# Patient Record
Sex: Female | Born: 1965 | Hispanic: No | Marital: Single | Smoking: Never smoker
Health system: Southern US, Community
[De-identification: ages and names within clinical notes are randomized; demographics above are authoritative.]

## PROBLEM LIST (undated history)

## (undated) DIAGNOSIS — J302 Other seasonal allergic rhinitis: Secondary | ICD-10-CM

## (undated) DIAGNOSIS — T7840XA Allergy, unspecified, initial encounter: Secondary | ICD-10-CM

## (undated) DIAGNOSIS — M1990 Unspecified osteoarthritis, unspecified site: Secondary | ICD-10-CM

## (undated) DIAGNOSIS — K219 Gastro-esophageal reflux disease without esophagitis: Secondary | ICD-10-CM

## (undated) DIAGNOSIS — N890 Mild vaginal dysplasia: Secondary | ICD-10-CM

## (undated) DIAGNOSIS — E039 Hypothyroidism, unspecified: Secondary | ICD-10-CM

## (undated) DIAGNOSIS — L932 Other local lupus erythematosus: Secondary | ICD-10-CM

## (undated) DIAGNOSIS — IMO0002 Reserved for concepts with insufficient information to code with codable children: Secondary | ICD-10-CM

## (undated) HISTORY — DX: Allergy, unspecified, initial encounter: T78.40XA

## (undated) HISTORY — PX: POLYPECTOMY: SHX149

## (undated) HISTORY — DX: Unspecified osteoarthritis, unspecified site: M19.90

## (undated) HISTORY — DX: Hypothyroidism, unspecified: E03.9

## (undated) HISTORY — DX: Reserved for concepts with insufficient information to code with codable children: IMO0002

## (undated) HISTORY — PX: DILATION AND CURETTAGE OF UTERUS: SHX78

## (undated) HISTORY — DX: Mild vaginal dysplasia: N89.0

## (undated) HISTORY — PX: OOPHORECTOMY: SHX86

## (undated) HISTORY — PX: TONSILLECTOMY: SUR1361

## (undated) HISTORY — DX: Other local lupus erythematosus: L93.2

## (undated) HISTORY — PX: REPLACEMENT TOTAL KNEE: SUR1224

## (undated) HISTORY — PX: KNEE SURGERY: SHX244

## (undated) HISTORY — DX: Other seasonal allergic rhinitis: J30.2

## (undated) HISTORY — PX: CHOLECYSTECTOMY: SHX55

## (undated) HISTORY — DX: Gastro-esophageal reflux disease without esophagitis: K21.9

## (undated) HISTORY — PX: PELVIC LAPAROSCOPY: SHX162

## (undated) HISTORY — PX: HYSTEROSCOPY: SHX211

## (undated) HISTORY — PX: NEURECTOMY FOOT: SUR888

## (undated) HISTORY — PX: ANKLE SURGERY: SHX546

---

## 1997-12-18 ENCOUNTER — Other Ambulatory Visit: Admission: RE | Admit: 1997-12-18 | Discharge: 1997-12-18 | Payer: Self-pay | Admitting: Gynecology

## 1999-01-28 ENCOUNTER — Other Ambulatory Visit: Admission: RE | Admit: 1999-01-28 | Discharge: 1999-01-28 | Payer: Self-pay | Admitting: Gynecology

## 2000-01-26 ENCOUNTER — Other Ambulatory Visit: Admission: RE | Admit: 2000-01-26 | Discharge: 2000-01-26 | Payer: Self-pay | Admitting: Gynecology

## 2000-12-03 ENCOUNTER — Encounter (INDEPENDENT_AMBULATORY_CARE_PROVIDER_SITE_OTHER): Payer: Self-pay | Admitting: Specialist

## 2000-12-03 ENCOUNTER — Ambulatory Visit (HOSPITAL_COMMUNITY): Admission: RE | Admit: 2000-12-03 | Discharge: 2000-12-03 | Payer: Self-pay | Admitting: Gynecology

## 2001-03-04 ENCOUNTER — Other Ambulatory Visit: Admission: RE | Admit: 2001-03-04 | Discharge: 2001-03-04 | Payer: Self-pay | Admitting: Gynecology

## 2001-08-06 ENCOUNTER — Ambulatory Visit (HOSPITAL_COMMUNITY): Admission: RE | Admit: 2001-08-06 | Discharge: 2001-08-06 | Payer: Self-pay | Admitting: Internal Medicine

## 2002-06-04 ENCOUNTER — Other Ambulatory Visit: Admission: RE | Admit: 2002-06-04 | Discharge: 2002-06-04 | Payer: Self-pay | Admitting: Gynecology

## 2003-06-12 ENCOUNTER — Other Ambulatory Visit: Admission: RE | Admit: 2003-06-12 | Discharge: 2003-06-12 | Payer: Self-pay | Admitting: Gynecology

## 2004-06-07 ENCOUNTER — Ambulatory Visit (HOSPITAL_COMMUNITY): Admission: RE | Admit: 2004-06-07 | Discharge: 2004-06-07 | Payer: Self-pay | Admitting: Orthopedic Surgery

## 2004-06-07 ENCOUNTER — Ambulatory Visit (HOSPITAL_BASED_OUTPATIENT_CLINIC_OR_DEPARTMENT_OTHER): Admission: RE | Admit: 2004-06-07 | Discharge: 2004-06-07 | Payer: Self-pay | Admitting: Orthopedic Surgery

## 2005-01-02 DIAGNOSIS — IMO0002 Reserved for concepts with insufficient information to code with codable children: Secondary | ICD-10-CM

## 2005-01-02 HISTORY — DX: Reserved for concepts with insufficient information to code with codable children: IMO0002

## 2005-03-26 ENCOUNTER — Emergency Department (HOSPITAL_COMMUNITY): Admission: EM | Admit: 2005-03-26 | Discharge: 2005-03-26 | Payer: Self-pay | Admitting: Emergency Medicine

## 2005-06-13 ENCOUNTER — Other Ambulatory Visit: Admission: RE | Admit: 2005-06-13 | Discharge: 2005-06-13 | Payer: Self-pay | Admitting: Gynecology

## 2005-06-28 ENCOUNTER — Ambulatory Visit (HOSPITAL_COMMUNITY): Admission: RE | Admit: 2005-06-28 | Discharge: 2005-06-29 | Payer: Self-pay | Admitting: Surgery

## 2005-06-28 ENCOUNTER — Encounter (INDEPENDENT_AMBULATORY_CARE_PROVIDER_SITE_OTHER): Payer: Self-pay | Admitting: *Deleted

## 2005-08-02 ENCOUNTER — Ambulatory Visit (HOSPITAL_BASED_OUTPATIENT_CLINIC_OR_DEPARTMENT_OTHER): Admission: RE | Admit: 2005-08-02 | Discharge: 2005-08-02 | Payer: Self-pay | Admitting: Gynecology

## 2005-08-02 ENCOUNTER — Encounter (INDEPENDENT_AMBULATORY_CARE_PROVIDER_SITE_OTHER): Payer: Self-pay | Admitting: Specialist

## 2006-07-18 ENCOUNTER — Other Ambulatory Visit: Admission: RE | Admit: 2006-07-18 | Discharge: 2006-07-18 | Payer: Self-pay | Admitting: Gynecology

## 2007-09-19 ENCOUNTER — Ambulatory Visit: Payer: Self-pay | Admitting: Gynecology

## 2007-09-19 ENCOUNTER — Other Ambulatory Visit: Admission: RE | Admit: 2007-09-19 | Discharge: 2007-09-19 | Payer: Self-pay | Admitting: Gynecology

## 2007-09-19 ENCOUNTER — Encounter: Payer: Self-pay | Admitting: Gynecology

## 2007-10-03 ENCOUNTER — Ambulatory Visit: Payer: Self-pay | Admitting: Gynecology

## 2007-10-31 ENCOUNTER — Ambulatory Visit: Payer: Self-pay | Admitting: Gynecology

## 2007-11-15 ENCOUNTER — Ambulatory Visit: Payer: Self-pay | Admitting: Gynecology

## 2007-11-19 ENCOUNTER — Ambulatory Visit (HOSPITAL_BASED_OUTPATIENT_CLINIC_OR_DEPARTMENT_OTHER): Admission: RE | Admit: 2007-11-19 | Discharge: 2007-11-19 | Payer: Self-pay | Admitting: Gynecology

## 2007-11-19 ENCOUNTER — Ambulatory Visit: Payer: Self-pay | Admitting: Gynecology

## 2007-11-19 ENCOUNTER — Encounter: Payer: Self-pay | Admitting: Gynecology

## 2007-12-03 ENCOUNTER — Ambulatory Visit: Payer: Self-pay | Admitting: Gynecology

## 2007-12-03 HISTORY — PX: ABDOMINAL HYSTERECTOMY: SHX81

## 2007-12-18 ENCOUNTER — Encounter: Payer: Self-pay | Admitting: Gynecology

## 2007-12-18 ENCOUNTER — Ambulatory Visit: Payer: Self-pay | Admitting: Gynecology

## 2007-12-18 ENCOUNTER — Inpatient Hospital Stay (HOSPITAL_COMMUNITY): Admission: RE | Admit: 2007-12-18 | Discharge: 2007-12-20 | Payer: Self-pay | Admitting: Gynecology

## 2008-01-01 ENCOUNTER — Ambulatory Visit: Payer: Self-pay | Admitting: Gynecology

## 2008-01-15 ENCOUNTER — Ambulatory Visit: Payer: Self-pay | Admitting: Gynecology

## 2008-09-21 ENCOUNTER — Other Ambulatory Visit: Admission: RE | Admit: 2008-09-21 | Discharge: 2008-09-21 | Payer: Self-pay | Admitting: Gynecology

## 2008-09-21 ENCOUNTER — Ambulatory Visit: Payer: Self-pay | Admitting: Gynecology

## 2008-09-21 ENCOUNTER — Encounter: Payer: Self-pay | Admitting: Gynecology

## 2008-11-18 ENCOUNTER — Ambulatory Visit: Payer: Self-pay | Admitting: Gynecology

## 2009-09-28 ENCOUNTER — Ambulatory Visit: Payer: Self-pay | Admitting: Gynecology

## 2009-09-28 ENCOUNTER — Other Ambulatory Visit: Admission: RE | Admit: 2009-09-28 | Discharge: 2009-09-28 | Payer: Self-pay | Admitting: Gynecology

## 2010-05-17 NOTE — H&P (Signed)
Mandy Bond, Mandy Bond                ACCOUNT NO.:  [id number]   MEDICAL RECORD NO.:  [id number]         PATIENT TYPE:  WINP   LOCATION:  310                           FACILITY:  [facility]   PHYSICIAN:  Timothy P. Fontaine, M.D.DATE OF BIRTH:  [birth year]   DATE OF ADMISSION:  12/18/2007  DATE OF DISCHARGE:                              HISTORY & PHYSICAL   CHIEF COMPLAINT:  1. Pelvic pain.  2. Dysmenorrhea.  3. Endometriosis.   HISTORY OF PRESENT ILLNESS:  A 45 year old G0 with pelvic pain,  persistent right adnexal cystic mass, CA-125 of 79, underwent diagnostic  laparoscopy November 2009 with findings of endometriosis, cul-de-sac  obliteration, bilateral adnexal adhesions.  The laparoscopy was  terminated at that point.  She is readmitted at this time for definitive  surgery to include TAH-BSO.   PAST MEDICAL HISTORY:  Hypothyroidism.   PAST SURGICAL HISTORY:  Hysteroscopy, D&C x2, cholecystectomy,  neurectomy of the foot, laparoscopy.   ALLERGIES:  VICODIN causes headaches, TOPICAL NEOMYCIN rash.   CURRENT MEDICATIONS:  Synthroid, Prilosec, vitamin D, Claritin as  needed.   REVIEW OF SYSTEMS:  Noncontributory.   FAMILY HISTORY:  Noncontributory.   SOCIAL HISTORY:  Noncontributory.   ADMISSION PHYSICAL EXAMINATION:  VITAL SIGNS:  Afebrile.  Vital signs  are stable.  HEENT: Normal.  LUNGS:  Clear.  CARDIAC:  Regular rate.  No rubs, murmurs, or gallops.  ABDOMEN:  Benign.  PELVIC:  External BUS, vagina normal.  Cervix normal.  Uterus normal  size, midline, mobile, nontender.  Adnexa without masses or tenderness.   ASSESSMENT:  A 45 year old G0, increasing menorrhagia, dysmenorrhea,  cystic adnexal mass on the right consistent with endometrioma.  Recent  laparoscopy confirming endometriosis with chocolate cyst material noted.  Due to the significant pelvic adhesions, the laparoscopy was abandoned.  She is admitted at this time for definitive surgery to include TAH-BSO.  I  reviewed the proposed surgery with the patient, the expected  intraoperative postoperative courses, as well as the options to include  attempted medical treatment such as Lupron, referral for consideration  of laparoscopy such as robotic up to including TAH-BSO.  The options for  ovarian conservation were reviewed.  If the left ovary would appear  normal, the options to keep that ovary, recognizing the risk for  persistent pain, recurrent endometriosis, recurrent surgery in the  future, as well as the risk of ovarian cancer long-term versus removing  both ovaries and the issues of hypoestrogenism, symptoms such as hot  flashes, night sweats, accelerated bone loss, possible accelerated  cardiovascular risks.  The options for ERT were reviewed.  [facility] study  discussed.  Increased risks of stroke, heart attack, DVT, as well as  possible increased risk of breast cancer all reviewed with her.  The  patient wants both ovaries removed and accepts the risks of ERT  subsequently.  The patient understands that the plan is to do a TAH-BSO,  but due to the extensive potential for scarring that she may have an  ovarian remnant syndrome, I may not be able to remove all the ovarian  tissue and that she  may have persistent ovarian function following the  procedure which could lead to complications in the future as well as the  potential for doing a supracervical hysterectomy if significant lower  segment scarring is found, and then the issues with supracervical  hysterectomy, possible persistent menstrual function, and cervical  disease in the future was all discussed with her.  The absolute  irreversible sterility associated with hysterectomy was discussed,  understood, and accepted.  Sexuality following hysterectomy was  reviewed.  The potential for persistent orgasmic dysfunction as well as  persistent dyspareunia was discussed, understood, and accepted.  The  acute risks of the surgery were discussed to  include infection requiring  prolonged antibiotic abscess formation requiring reoperation, drainage  of abscess or hematoma formation, wound complications requiring opening  and draining of incisions closure by secondary intention, long-term  issues including scarring, cosmetics, hernia formation were reviewed.  The risks of hemorrhage necessitating transfusion and risks of  transfusion discussed to include transfusion reaction, hepatitis, HIV,  myocardial disease, and other unknown entities.  Realistic risk of  internal organ damage particularly bowel, bladder, and ureters given the  anatomy which I reviewed with her and the potential for significant  scarring associated with her endometriosis and the potential for  reparative surgeries such as bladder repair, ureteral damage repair,  reimplantation, bowel resection, possible ostomy formation was all  discussed, understood, and accepted.  The patient's questions were  answered to her satisfaction.  She has a realistic understanding of the  procedure and the risks, and is ready to proceed with the surgery.  She  will have a preoperative bowel preparation both mechanical as well as  antibiotics preoperatively.      Timothy P. Fontaine, M.D.  Electronically Signed     TPF/MEDQ  D:  12/03/2007  T:  12/03/2007  Job:  [id number]

## 2010-05-17 NOTE — Op Note (Signed)
Mandy Bond, Mandy Bond                ACCOUNT NO.:  [id number]   MEDICAL RECORD NO.:  [id number]          PATIENT TYPE:  INP   LOCATION:  9310                          FACILITY:  [facility]   PHYSICIAN:  Timothy P. Fontaine, M.D.DATE OF BIRTH:  May 16, 1965   DATE OF PROCEDURE:  12/18/2007  DATE OF DISCHARGE:                               OPERATIVE REPORT   PREOPERATIVE DIAGNOSES:  Stage IV endometriosis.   POSTOPERATIVE DIAGNOSIS:  Stage IV endometriosis.   PROCEDURES:  Total abdominal hysterectomy, bilateral salpingo-  oophorectomy.   SURGEON:  Timothy P. Fontaine, MD   ASSISTANT:  Rande Brunt. Eda Paschal, MD   ANESTHETIC:  General.   COMPLICATIONS:  None.   ESTIMATED BLOOD LOSS:  200 mL.   URINE OUTPUT:  Intraoperative 350 mL clear and yellow urine.   SPECIMEN:  Uterus, cervix sent separately, bilateral fallopian tubes and  ovaries.   FINDINGS:  Stage IV endometriosis with endometrioma extending from the  right adnexa into the posterior cul-de-sac.  Posterior cul-de-sac  initially totally obliterated with endometriosis.  At the end of the  procedure, no active disease noted.  The right and left ovaries grossly  normal other than some brown, shaggy evidence of endometriosis on the  capsule bilaterally.  The right left fallopian tube segments grossly  normal bilaterally.  Uterus grossly normal.  Upper abdominal digital  exam was palpably normal.  Appendix not visualized.   PROCEDURE:  The patient was taken to the operating room and underwent  general anesthesia, placed in supine position, received perineal and  vaginal preparation with Betadine solution.  Bladder emptied with  indwelling Foley catheterization and the patient was draped in the usual  fashion.  The abdomen was sharply entered through a Pfannenstiel  incision without difficulty achieving adequate hemostasis at all levels.  Initially, the Balfour retractor and bladder blade were placed within  the incision, at which time  due to the need for better exposure, the  retractor was switched with a Bookwalter retractor.  The intestines were  packed from the operative field.  The posterior cul-de-sac was bluntly  developed digitally entering between the sigmoid and the posterior  uterine surface developing a plane to separate the tissues bluntly.  This was performed down to the level of the lower uterine segment to the  cervix.  During this process, an endometrioma was entered with a brown  classic endometriotic fluid extruded which was irrigated and aspirated.  The right round ligament was identified, transected with electrocautery,  and the peritoneal reflection overlying the right infundibulopelvic  ligament and vessels were then incised.  The infundibulopelvic ligament  vessels skeletonized and subsequently doubly clamped, cut, and doubly  ligated using 0 Vicryl suture.  A similar procedure was carried out on  the other side.  The right uterine vessels were then skeletonized and  the anterior vesicouterine peritoneal reflection was sharply incised to  the midline.  The uterine vessels were then doubly clamped, cut, and  doubly ligated using 0 Vicryl suture.  A similar procedure was carried  out on the other side meeting the peritoneal reflection incision in the  midline.  At this point, a supracervical hysterectomy was performed.  The cervical stump grasped with Allis clamps, and through sharp and  blunt dissection, the posterior cul-de-sac was continually developed to  below the cervix to allow amputation of the cervical stump.  The  paracervical tissues were then clamped, cut, and ligated including the  cardinal ligaments using 0 Vicryl suture to progressively free the  cervix and continually developing the anterior vesicouterine fold  sharply as the process ensued.  Ultimately when the cervix was freed,  the upper vagina was cross-clamped and the cervix was excised.  The  right and left vaginal angle  sutures were placed using 0 Vicryl suture  and tagged for future reference.  The vagina was then closed anterior to  posterior using 0 Vicryl suture in interrupted figure-of-eight stitch.  The pelvis was copiously irrigated showing overall adequate hemostasis.  There was slight oozing from the right sidewall area where the ovary was  freed where there was the endometrioma and ultimately Surgicel was  placed and achieved ultimate hemostasis.  The bowel packing was then  removed.  The retractor removed and the anterior fascia was  reapproximated using 0 Vicryl suture in running stitch starting at the  angle and meeting in the middle.  The subcutaneous tissues were  irrigated.  Hemostasis achieved with electrocautery.  The skin  reapproximated using 4-0 Vicryl in a running subcuticular stitch.  Benzoin, Steri-Strips applied.  Pressure and sterile dressing applied.  The patient awakened without difficulty and taken to recovery room in  good condition having tolerated the procedure well.      Timothy P. Fontaine, M.D.  Electronically Signed     TPF/MEDQ  D:  12/18/2007  T:  12/19/2007  Job:  [id number]

## 2010-05-17 NOTE — Discharge Summary (Signed)
Mandy Bond, Mandy Bond                ACCOUNT NO.:  [id number]   MEDICAL RECORD NO.:  [id number]          PATIENT TYPE:  INP   LOCATION:  9310                          FACILITY:  [facility]   PHYSICIAN:  Timothy P. Fontaine, M.D.DATE OF BIRTH:  1965/09/10   DATE OF ADMISSION:  12/18/2007  DATE OF DISCHARGE:  12/20/2007                               DISCHARGE SUMMARY   DISCHARGE DIAGNOSIS:  Stage IV endometriosis.   PROCEDURE:  Total abdominal hysterectomy and bilateral salpingo-  oophorectomy on December 18, 2007.   PATHOLOGY:  Pending.   HOSPITAL COURSE:  A 45 year old G0, increasing pelvic pain,  dysmenorrhea, laparoscopic proven stage IV endometriosis, underwent  uncomplicated TAH-BSO, removal endometrioma on December 18, 2007.  She  did have significant posterior cul-de-sac adhesive disease noted at the  time of surgery.  Her postoperative course was uncomplicated and she was  discharged on postoperative day #2, ambulating well, tolerating liquid  diet, eating solid, and voiding without difficulty.  She has not passed  gas or had a BM at the time of discharge, but feels comfortable with  discharge home with precautions given as far as a light diet until  flatus.  Routine precautions, instructions, and followup were reviewed.  ASAP call precautions discussed to include temperature elevations,  increasing abdominal pain, vaginal bleeding, increasing nausea or  vomiting.  She was given a prescription for Dilaudid 4 mg tablets #20  one p.o. q.6 h. p.r.n. pain, Phenergan 25 mg #10 p.r.n. nausea, and she  will return in 2 weeks following discharge for postop evaluation.  The  patient did have a subcuticular stitch with Steri-Strips and did not  require staple removal.  The patient's preoperative hemoglobin was 12.1  with a postoperative hemoglobin of 10.1.      Timothy P. Fontaine, M.D.  Electronically Signed     TPF/MEDQ  D:  12/20/2007  T:  12/20/2007  Job:  [id number]

## 2010-05-17 NOTE — H&P (Signed)
Mandy Bond, Mandy Bond                ACCOUNT NO.:  [id number]   MEDICAL RECORD NO.:  [id number]          PATIENT TYPE:  AMB   LOCATION:  [facility]                         FACILITY:  [facility]   PHYSICIAN:  Timothy P. Fontaine, M.D.DATE OF BIRTH:  Jan 12, 1965   DATE OF ADMISSION:  DATE OF DISCHARGE:                              HISTORY & PHYSICAL   CHIEF COMPLAINT:  Pelvic pain, dysmenorrhea, persistent right adnexal  mass.   HISTORY OF PRESENT ILLNESS:  This 45 year old gravida 0 presented for  her annual exam complaining of increasing dysmenorrhea and menorrhagia  over the past several cycles.  Initial sonohystogram ultrasound showed a  negative sonohistogram,  no intracavitary defects, a negative  endometrial biopsy but a right ovarian cystic mass measuring 38 mm with  diffuse homogeneous low level echoes, negative peripheral flow  suspicious for endometrioma, possible functional cyst.  She had a repeat  ultrasound 1 month later which showed enlargement at 60 mm, again with a  homogeneous internal low-level echo pattern, negative peripheral flow.  A CA125 was done which returned 79.  She is admitted at this time for  hysteroscopic evaluation and  right salpingo-oophorectomy.   PAST MEDICAL HISTORY:  Significant for hypothyroidism.   PAST SURGICAL HISTORY:  Hysteroscopy D&C x2, cholecystectomy and  neurectomy of the foot.   ALLERGIES:  VICODIN causes headaches.  TOPICAL NEOMYCIN.   CURRENT MEDICATIONS:  Prilosec, Synthroid, vitamin D, Claritin as  needed.   REVIEW OF SYSTEMS:  Noncontributory.   FAMILY HISTORY:  Noncontributory.   SOCIAL HISTORY:  Noncontributory.   PHYSICAL EXAMINATION:  VITAL SIGNS:  Afebrile, vital signs stable.  HEENT: Normal.  LUNGS:  Clear.  CARDIAC:  Regular rate and rhythm.  No rubs, murmurs or gallops.  ABDOMEN:  Benign.  PELVIC:  External BUS, vagina normal.  Cervix normal.  Uterus normal  size, midline, mobile, nontender.  Adnexa without masses or  tenderness.   ASSESSMENT:  A 45 year old gravida 0, increasing menorrhagia,  dysmenorrhea.  Ultrasound showing a cystic mass in the right adnexa, low-  level homogeneous echo suspicious for endometriosis with a low-level  positive CA125 at 79.  Reviewed situation with the patient.  Presumptive  diagnosis is endometriosis with endometriomas, possible physiologic  cystic changes and ovarian cancer.  Options for management were  discussed to include attempted cystectomy versus proceeding with  oophorectomy or salpingo-oophorectomy and given the total picture a  salpingo-oophorectomy is probably most prudent.  I discussed the issues  of ovarian conservation, continued hormone production as well as  fertility issues.  Childbearing is not an issue.  She has no plans for  children and removing the ovary is fine with her.  She also understands  clearly the possibilities of ovarian cancer and that if we encounter  overt ovarian cancer, that we will terminate the procedure at that point  and refer her to a gynecologic oncologist for definitive surgery and  treatment.  She also understands the issues of rupture if ovarian cancer  and the issues of prognosis and does this change prognosis issues  reviewed.  The patient also understands she may have extensive  endometriosis with extensive scarring and that may limit what we can do  surgically.  I may not be able to remove the entire adnexa or  significant endometriosis, at least make the diagnosis and then long-  term plans will be discussed postoperatively.  She understands that I  will address what is felt to be safe to address at the time of surgery,  that if there is endometriosis or other disease involving bowel  overlying large vessels or ureters, that I may leave this undisturbed.  Involved with the surgery to include the laparoscopic approach, multiple  port sites, trocar placement, insufflation, use of sharp and blunt  dissection,  electrocautery, laser, Harmonic scalpel were all discussed,  understood and accepted.  The risks of infection requiring prolonged  antibiotics as well as the risk of hemorrhage necessitating transfusion  and the risks of transfusion including transfusion reaction, hepatitis,  HIV, mad cow disease, and other unknown entities were all discussed,  understood and accepted.  The risk of inadvertent injury to internal  organs immediately recognized or delay recognized, again realistically  understanding the potential for significant scarring with endometriosis  and the anatomy of the ureters and intestines were reviewed with her and  the potential for damage to bowel, bladder, ureters, vessels, and nerves  necessitating major exploratory reparative surgeries, future reparative  surgeries, bowel resection, bladder repair, ureteral damage repair,  ostomy formation were all discussed, understood and accepted.  The  patient's questions were answered to her satisfaction and she is ready  to proceed with surgery.  She will have a preoperative bowel prep,  mechanical as well as antibiotic preoperatively.      Timothy P. Fontaine, M.D.  Electronically Signed     TPF/MEDQ  D:  11/15/2007  T:  11/15/2007  Job:  [id number]

## 2010-05-17 NOTE — Op Note (Signed)
NAMEDWAYNE, Mandy Bond                ACCOUNT NO.:  [id number]   MEDICAL RECORD NO.:  [id number]          PATIENT TYPE:  AMB   LOCATION:  [facility]                         FACILITY:  [facility]   PHYSICIAN:  Timothy P. Fontaine, M.D.DATE OF BIRTH:  July 18, 1965   DATE OF PROCEDURE:  11/19/2007  DATE OF DISCHARGE:                               OPERATIVE REPORT   PREOPERATIVE DIAGNOSES:  1. Pelvic pain.  2. Dysmenorrhea.  3. Right adnexal mass.   POSTOPERATIVE DIAGNOSIS:  Stage IV endometriosis.   PROCEDURE:  Diagnostic laparoscopy.   SURGEON:  Dr. Audie Box.   ASSISTANT:  Dr. Lily Peer.   ANESTHETIC:  General with 0.25% Marcaine skin incision injections.   COMPLICATIONS:  None.   SPECIMEN:  Opening cell washing.   ESTIMATED BLOOD LOSS:  Minimal.   FINDINGS:  EUA, external BUS, vagina normal.  Cervix grossly normal.  Bimanual, uterus grossly normal, midline mobile, adnexa without gross  masses.  Exam somewhat limited by abdominal girth.  Laparoscopic, stage  IV endometriosis with posterior cul-de-sac obliteration to the level of  mid posterior uterine surface.  Right adnexa encased in adhesions  involving small and large intestine.  Tuft of fimbriated end of  fallopian tube visible.  No grossly normal ovarian tissue visualized.  Left adnexa with ovary and fallopian tube in an adhesive mass involving  small and large intestine.  Superior aspect of the ovary visualized with  a simple appearing cyst extruding from the cortex left undisturbed.  Tuft of normal appearing fimbria noted.  Several shaggy areas of brown  endometriosis noted along the posterior uterine surface at the interface  with the cul-de-sac obliteration.  Initial blunt probing revealed brown  endometriotic fluid from the posterior cul-de-sac region which was  drained.  Upper abdominal exam was grossly normal.  No evidence of  adhesive disease.   DESCRIPTION OF PROCEDURE:  The patient was taken to the operating room,  underwent general anesthesia and was placed in the low dorsal lithotomy  position.  She received a abdominal perineal vaginal preparation with  Betadine solution.  EUA performed.  Foley catheter placed in a sterile  technique.  Hulka tenaculum placed on the cervix.  The patient was  draped in the usual fashion.  A vertical infraumbilical incision was  made using the 10-mm Optiview direct entry trocar.  The abdomen was  directly entered without difficulty.  The abdomen was insufflated.  Subsequently, right and left 5-mm suprapubic ports were placed under  direct visualization after transillumination for the vessels without  difficulty.  Examination of pelvic organs and upper abdominal exam was  carried out with findings noted above.  An opening cell washing was then  obtained and sent to pathology.  At this point due to the extensive  adhesive disease as previously discussed with the patient  preoperatively, it was decided to terminate the procedure.  It was felt  that any procedure short of hysterectomy, bilateral salpingo-  oophorectomy would leave active endometriotic disease and attempts to do  a more conservative procedure would risk intestinal sidewall ureteral  damage and/or necessitate exploratory laparotomy at this point.  The  laparoscopy was terminated at this point.  There was hemostasis  visualized in the pelvis.  The right and left 5-mm suprapubic pubic  ports were removed.  The gas slowly allowed to escape.  Again,  hemostasis visualized and the infraumbilical port was backed out under  direct visualization showing adequate hemostasis.  No evidence of hernia  formation.  All skin incisions were injected using 0.25% Marcaine.  A 0-  Vicryl interrupted subcutaneous fascial stitch was placed  infraumbilically.  All skin incisions closed with Dermabond skin  adhesive.  The Hulka tenaculum was removed.  The patient placed in the  supine position, awakened without difficulty and  taken to recovery room  in good condition having tolerated the procedure well.      Timothy P. Fontaine, M.D.  Electronically Signed     TPF/MEDQ  D:  11/19/2007  T:  11/19/2007  Job:  [id number]

## 2010-05-20 NOTE — H&P (Signed)
[facility]  Patient:    Mandy Bond, Mandy Bond Visit Number: [id number] MRN: [id number]          Service Type: Attending:  Nadyne Coombes. Fontaine, M.D. Dictated by:   Nadyne Coombes. Fontaine, M.D. Adm. Date:  12/03/00                           History and Physical  CHIEF COMPLAINT:              Irregular bleeding.  HISTORY OF PRESENT ILLNESS:   A 45 year old G0 with history of premenstrual spotting several days to a week before her period over approximately six months time.  Examination shows a normal physical exam, a normal thyroid, and a normal prolactin check.  The patient underwent a sonohysterogram which overall was normal and with injection of fluid showed a possible posterior wall filling defect measuring 14 x 4 mm which represents either a possible polyp versus a physiologic endometrial thickening.  The patient is admitted at this time for hysteroscopy D&C.  PAST MEDICAL HISTORY:         Uncomplicated other than seasonal allergies.  PAST SURGICAL HISTORY:        Uncomplicated.  ALLERGIES:                    No medications.  CURRENT MEDICATIONS:          Intermittent use of Claritin, Nasonex, Flovent.  FAMILY HISTORY:               Noncontributory.  SOCIAL HISTORY:               Noncontributory.  ADMISSION PHYSICAL EXAMINATION:  VITAL SIGNS:                  Afebrile, vital signs are stable.  HEENT:                        Normal.  LUNGS:                        Clear.  CARDIAC:                      Regular rate without rubs, murmurs, or gallops.  ABDOMEN:                      Benign.  PELVIC:                       External, BUS, vagina normal.  Cervix normal. Uterus normal size, nontender.  Adnexa without masses or tenderness.  ASSESSMENT:                   A 45 year old G0 with consistent premenstrual spotting over six months.  Sonohysterogram reveals thickened posterior wall endometrium, questionable polyp versus physiologic change.   Options for expectant management with re-ultrasound in several months versus hysteroscopy were discussed, and the patient would prefer to proceed with hysteroscopy.  I discussed what is involved with the procedure to include instrumentation and the risks, and I reviewed the risks of perforation; damage to internal organs including bowel, bladder, ureters, vessels, and nerves necessitating major exploratory reparative surgeries and future reparative surgeries including ostomy formation.  The risks of transfusion as well as the risks of infection requiring prolonged antibody screen were all discussed with her.  The  patients questions are answered to her satisfaction and she is ready to proceed with surgery. Dictated by:   Nadyne Coombes. Fontaine, M.D. Attending:  Nadyne Coombes. Fontaine, M.D. DD:  11/27/00 TD:  11/27/00 Job: [id number]

## 2010-05-20 NOTE — Op Note (Signed)
[name], [name]                ACCOUNT NO.:  [id number]   MEDICAL RECORD NO.:  [id number]          PATIENT TYPE:  OIB   LOCATION:  1318                         FACILITY:  [facility]   PHYSICIAN:  Thornton Park. Daphine Deutscher, MD  DATE OF BIRTH:  06-28-1965   DATE OF PROCEDURE:  06/28/2005  DATE OF DISCHARGE:                                 OPERATIVE REPORT   CCS NUMBER:  [id number].   PREOPERATIVE DIAGNOSIS:  Biliary dyskinesia.   POSTOPERATIVE DIAGNOSIS:  Biliary dyskinesia.   PROCEDURE:  Laparoscopic cholecystectomy with intraoperative cholangiogram.   SURGEON:  Dr. Daphine Deutscher   ASSISTANT:  Dr. Luisa Hart   ANESTHESIA:  General.   DESCRIPTION OF PROCEDURE:  Mandy Bond was taken to room one on 06/28/2005  and given general anesthesia.  The abdomen was prepped with chlorhexidine  and draped sterilely.  Longitudinal incision was made down in the umbilicus  and the abdomen was entered without difficulty.  It was insufflated.  10 mm  Hasson was used the umbilicus and three 5 mm were used in the upper abdomen.  The gallbladder was grasped, elevated and the Calot's triangle was dissected  free. A clip was placed in the gallbladder.  I incised the cystic duct  inserted a Reddick catheter took a dynamic cholangiogram which showed prompt  filling of the common duct, free flow of the duodenum.  Cystic duct was  triple clipped and divided.  Cystic artery was triple clipped and divided  and the gallbladder was removed from the gallbladder bed with electrocautery  without entering it.  Hemostasis was achieved during the course of the  dissection and the gallbladder was removed and brought out the umbilicus.  I  went back and looked at the gallbladder bed it appeared dry.  The ports were  all injected with 0.5% Marcaine.  The umbilical defect was repaired with 0  Vicryl under laparoscopic vision with a 5 mm scope from above and then the  wounds were closed with 4-0 Vicryl with Benzoin and Steri-Strips.  The  patient seemed to tolerate the procedure well and was taken to recovery room  satisfactory condition.      Thornton Park Daphine Deutscher, MD  Electronically Signed     MBM/MEDQ  D:  06/28/2005  T:  06/29/2005  Job:  [id number]   cc:   Talmadge Coventry, M.D.  Fax: [phone]

## 2010-05-20 NOTE — Op Note (Signed)
[facility]  Patient:    Mandy Bond, Mandy Bond Visit Number: [id number] MRN: [id number]          Service Type: DSU Location: [facility] Attending Physician:  Merrily Pew Dictated by:   Nadyne Coombes Fontaine, M.D. Proc. Date: 12/03/00 Admit Date:  12/03/2000                             Operative Report  PREOPERATIVE DIAGNOSIS:        Irregular bleeding.  POSTOPERATIVE DIAGNOSIS:      Dysfunctional uterine bleeding.  PROCEDURE:                    Hysteroscopy, dilation and curettage.  SURGEON:                      Timothy P. Fontaine, M.D.  ANESTHESIA:                   General.  ESTIMATED BLOOD LOSS:         Minimal.  SORBITOL DISCREPANCY:         Less than 70 cc.  COMPLICATIONS:                None.  SPECIMENS:                    Endometrial curettings.  FINDINGS:                     Focal posterior wall endometrial thickening.  No true polyp.  Hysteroscopy otherwise normal, noting right and left tubal ostia. The fundus, anterior and posterior uterine surfaces, lower uterine segment and endocervical canal all were inspected.  DESCRIPTION OF PROCEDURE:     The patient was taken to the operating room and underwent general anesthesia.  SHe was placed in the low dorsal lithotomy position and received perineal and vaginal preparation with Betadine solution. The bladder was emptied with in-and-out Foley catheterization.  EUA was performed.  The patient was draped in the usual fashion.  The cervix was visualized with a speculum, grasped with a single-tooth tenaculum on the anterior lip and gently dilated to admit the diagnostic hysteroscope. Hysteroscopy was performed with findings noted above.  A sharp curettage was then performed, with rehysteroscopy showing good sampling of the cavity and good distention, with no evidence of perforation.  The instruments were all removed.  Adequate hemostasis was visualized.  The speculum was removed.   The patient was placed in the supine position, awakened without difficulty and taken to the recovery room in good condition, having tolerated the procedure well.  Reported sorbitol discrepancy was 70 cc, noting a fair amount on the floor. Dictated by:   Nadyne Coombes. Fontaine, M.D. Attending Physician:  Merrily Pew DD:  12/03/00 TD:  12/03/00 Job: [id number]

## 2010-05-20 NOTE — Op Note (Signed)
NAMERAYMIE, TRANI                ACCOUNT NO.:  [id number]   MEDICAL RECORD NO.:  [id number]          PATIENT TYPE:  AMB   LOCATION:  DSC                          FACILITY:  [facility]   PHYSICIAN:  Leonides Grills, M.D.     DATE OF BIRTH:  1965/04/08   DATE OF PROCEDURE:  06/07/2004  DATE OF DISCHARGE:                                 OPERATIVE REPORT   PREOPERATIVE DIAGNOSIS:  Right ankle impingement.   POSTOPERATIVE DIAGNOSIS:  Right ankle impingement.   OPERATION PERFORMED:  Right ankle arthroscopy with extensive debridement.   SURGEON:  Leonides Grills, M.D.   ASSISTANT:  Lianne Cure, P.A.   ANESTHESIA:  General with popliteal block.   ESTIMATED BLOOD LOSS:  Minimal.   TOURNIQUET TIME:  None.   COMPLICATIONS:  None.   DISPOSITION:  Stable to PR.   INDICATIONS FOR PROCEDURE:  The patient is a 45 year old female with  longstanding anterior, medial and lateral ankle pain that is interfering  with her life to the point where she cannot do what she wants to do.  She  was consented for the above procedure.  All risks which include infection,  neurovascular injury, sinus formation, persistent pain, worsening pain,  stiffness, arthritis and prolonged recovery were all explained, questions  were encouraged and answered.   DESCRIPTION OF PROCEDURE:  The patient was brought to the operating room and  placed in supine position after adequate general endotracheal tube  anesthesia was administered with popliteal block as well as Ancef 1 g IV  piggyback.  The right lower extremity was then prepped and draped in sterile  manner over a proximally placed thigh tourniquet which was not used.  We  started the procedure once the leg was prepped and draped in sterile manner  by mapping out the anterior tibialis tendon, peroneus tertius and  superficial peroneal nerve.  Spinal needle was then placed just medial to  the anterior tibialis tendon into the joint and 20 cc normal saline was  instilled in the ankle joint.  Anteromedial portal was then established with  nick and spread technique.  Blunt tip with cannula followed by the camera  was then placed into the ankle. There was a tremendous amount of synovitis  in the anterior aspect of the ankle especially anteromedially and laterally.  Under direct visualization, the anterolateral portal was then created just  lateral to the peroneus tertius tendon and superficial peroneal nerve using  a nick and spread technique after a spinal needle was placed.  The accessory  tib-fib ligament was rubbing the anterolateral corners of the talar dome.  There was a large amount of synovitis in this area.  This was then removed  with a shaver as well as a bevel radiofrequency wand.  The lateral gutter  was also debrided as well as anteriorly.  There were no osteochondral  lesions. We then switched the scope to the anterolateral portal and there  was a flap of soft tissue in the corner of the tibial plafond that was  rubbing the anteromedial corner of the talar dome. There was a large amount  of synovitis in this area.  This was then debrided meticulously with a  shaver, bevel, grabber.  Once this was completely debrided as well as the  synovitis in this area, pictures were taken throughout the procedure.  There  were no osteochondral lesions.  Scope was removed.  The wound was closed  with 4-0 nylon suture.  Sterile dressing was applied.  Cam walker boot was  applied.  The patient was stable to the PR.       PB/MEDQ  D:  06/07/2004  T:  06/07/2004  Job:  [id number]

## 2010-05-20 NOTE — H&P (Signed)
NAMEKARN, DERK                ACCOUNT NO.:  [id number]   MEDICAL RECORD NO.:  [id number]          PATIENT TYPE:  AMB   LOCATION:  NESC                         [facility]ACILITY[facility]   PHYSICIAN:  Timothy P. Fontaine, M.D.DATE OF BIRTH:  1965-12-11   DATE OF ADMISSION:  08/02/2005  DATE OF DISCHARGE:                                HISTORY & PHYSICAL   CHIEF COMPLAINT:  Glandular cell abnormality, atypical glandular cells on  recent Pap smear.  Favor endometrial origin.   HISTORY OF PRESENT ILLNESS:  45 year old, G0 presented for annual exam, was  found to have atypical endometrial cells on Pap smears.  Outpatient  evaluation included colposcopic evaluation which showed areas of acetowhite  change and a pigmented area all of which were biopsied.  Her acetowhite  changes showed hyperkeratosis with chronic cervicitis.  The pigmented areas  showed endometriosis.  The patient also had a sonohysterogram which showed a  thickened endometrium anterior wall but no definitive polyps or other  abnormalities.  The patient is admitted at this time for hysteroscopic  evaluation, D and C, due to the atypical endometrial cells on her Pap smear.   PAST MEDICAL HISTORY:  Includes hypothyroidism.   PAST SURGICAL HISTORY:  Includes right ankle surgery and hysteroscopy and  D&C 12/2000.   CURRENT MEDICATIONS:  Include Zyrtec for allergies, Armour Thyroid for  thyroid replacement, Protonix for the gastroesophageal reflux disease.   ALLERGIES:  No medications.   REVIEW OF SYSTEMS:  Noncontributory.   FAMILY HISTORY:  Noncontributory.   SOCIAL HISTORY:  Noncontributory.   PHYSICAL EXAM:  Afebrile.  Vital signs stable.  HEENT: Normal.  LUNGS: Clear.  CARDIAC: Regular rate without rubs, murmurs or gallops.  ABDOMINAL EXAM:  Benign.  PELVIC: External BUS and vagina normal.  Cervix normal.  Uterus normal size,  midline, mobile.  Adnexa without mass or tenderness.   ASSESSMENT:  Patient with  atypical glandular cells on Pap smear favor  endometrial origin did have a focus of endometriosis on her cervix which is  more than likely the source of her cells.  On sonohysterogram she has a  thickened anterior endometrium, no definitive polyps and she is admitted for  hysteroscopic evaluation D and C.  The proposed surgery was discussed with  the patient to include the expected intraoperative postoperative courses,  use of the hysteroscope, resectoscope and sharp curettage.  The risks of  bleeding, transfusion, infection, uterine perforation,  damage to internal organs including bowel, bladder, ureters, vessels and  nerves necessitating major exploratory reparative surgeries and future  reparative surgeries including ostomy formation was all discussed,  understood and accepted.  The patient's questions were answered to her  satisfaction.  She is ready to proceed with surgery.      Timothy P. Fontaine, M.D.  Electronically Signed     TPF/MEDQ  D:  07/31/2005  T:  07/31/2005  Job:  [id number]

## 2010-05-20 NOTE — Op Note (Signed)
NAMEALVINA, Mandy Bond                ACCOUNT NO.:  [id number]   MEDICAL RECORD NO.:  [id number]          PATIENT TYPE:  AMB   LOCATION:  NESC                         FACILITY:  [facility]   PHYSICIAN:  Timothy P. Fontaine, M.D.DATE OF BIRTH:  [birth year]   DATE OF PROCEDURE:  08/02/2005  DATE OF DISCHARGE:                                 OPERATIVE REPORT   PREOPERATIVE DIAGNOSIS:  Atypical glandular cells on Pap smear.   POSTOPERATIVE DIAGNOSIS:  Atypical glandular cells on Pap smear.   PROCEDURES:  Hysteroscopy, D&C.   SURGEON:  Timothy P. Fontaine, M.D.   ANESTHETIC:  MAC.   ESTIMATED BLOOD LOSS:  Minimal.   COMPLICATIONS:  None.   SPECIMENS:  1.  Endocervical curetting  2.  Endometrial curetting.   FINDINGS:  EUA:  External BUS/vagina grossly normal.  Cervix grossly normal.  Bimanual uterus, normal size, midline, immobile, anteverted.  Adnexa without  masses.  Hysteroscopic:  Exam was adequate noting fundus, anterior-posterior  uterine surfaces, right and left tubal ostia, lower uterine segment and  endocervical canal all visualized.   PROCEDURE:  The patient was taken to the operating room, underwent general  anesthesia, was placed in the low dorsal lithotomy position, received a  perineal vaginal preparation with Betadine solution.  Bladder emptied with  in-and-out Foley catheterization using sterile technique.  EUA was  performed.  The patient was draped in the usual fashion.  Cervix was  visualized with a speculum.  It was grossly normal without lesions grossly  visible.  A single-tooth tenaculum was applied to the anterior lip.  The  cervix was gently dilated to admit the diagnostic hysteroscope and  hysteroscopy was performed with normal findings as noted above.  Hysteroscope was removed, the endocervical curetting performed.  Subsequently, the endometrial curetting was performed, and these specimens  were sent separately.  Re-hysteroscopy showed an empty cavity, good  distension, no evidence of perforation.  The  instruments were removed, hemostasis visualized at the cervix and tenaculum  sites.  The patient was placed in supine position, awakened without  difficulty, and taken to recovery room in good condition having tolerated  the procedure well.      Timothy P. Fontaine, M.D.  Electronically Signed     TPF/MEDQ  D:  08/02/2005  T:  08/02/2005  Job:  [id number]

## 2010-09-23 DIAGNOSIS — E039 Hypothyroidism, unspecified: Secondary | ICD-10-CM | POA: Insufficient documentation

## 2010-09-23 DIAGNOSIS — J302 Other seasonal allergic rhinitis: Secondary | ICD-10-CM | POA: Insufficient documentation

## 2010-09-30 ENCOUNTER — Encounter: Payer: Self-pay | Admitting: Gynecology

## 2010-10-06 LAB — COMPREHENSIVE METABOLIC PANEL
Albumin: 3.5 g/dL (ref 3.5–5.2)
BUN: 6 mg/dL (ref 6–23)
CO2: 25 mEq/L (ref 19–32)
Chloride: 107 mEq/L (ref 96–112)
GFR calc non Af Amer: >60 mL/min (ref >60)
Glucose, Bld: 99 mg/dL (ref 70–99)
Total Bilirubin: 1 mg/dL (ref 0.3–1.2)

## 2010-10-06 LAB — CBC
Hemoglobin: 12.1 g/dL (ref 12.0–15.0)
MCHC: 34.6 g/dL (ref 30.0–36.0)
MCV: 90.1 fL (ref 78.0–100.0)
Platelets: 240 10*3/uL (ref 150–400)
Platelets: 280 10*3/uL (ref 150–400)
RBC: 3.18 MIL/uL — ABNORMAL LOW (ref 3.87–5.11)
RBC: 3.9 MIL/uL (ref 3.87–5.11)
RDW: 14.4 % (ref 11.5–15.5)
WBC: 8.4 10*3/uL (ref 4.0–10.5)
WBC: 8.6 10*3/uL (ref 4.0–10.5)

## 2010-10-21 ENCOUNTER — Ambulatory Visit (INDEPENDENT_AMBULATORY_CARE_PROVIDER_SITE_OTHER): Payer: Managed Care, Other (non HMO) | Admitting: Gynecology

## 2010-10-21 ENCOUNTER — Other Ambulatory Visit (HOSPITAL_COMMUNITY)
Admission: RE | Admit: 2010-10-21 | Discharge: 2010-10-21 | Disposition: A | Discharge status: Home / Self Care | Payer: Managed Care, Other (non HMO) | Source: Ambulatory Visit | Attending: Gynecology | Admitting: Gynecology

## 2010-10-21 ENCOUNTER — Encounter: Payer: Self-pay | Admitting: Gynecology

## 2010-10-21 VITALS — BP 120/76 | Ht 62.0 in | Wt 214.0 lb

## 2010-10-21 DIAGNOSIS — Z01419 Encounter for gynecological examination (general) (routine) without abnormal findings: Secondary | ICD-10-CM | POA: Insufficient documentation

## 2010-10-21 DIAGNOSIS — Z79890 Hormone replacement therapy (postmenopausal): Secondary | ICD-10-CM

## 2010-10-21 MED ORDER — ESTRADIOL 0.075 MG/24HR TD PTTW: MEDICATED_PATCH | TRANSDERMAL | Status: DC

## 2010-10-21 NOTE — Progress Notes (Signed)
Mandy Bond [birth year] [id number]        45 y.o.  for annual exam.  Doing well status post TAH/BSO in the past on ERT Vivelle 0.075 patches.  She does have a history of AGUS Pap smear in 2007 with workup suggesting endometriosis of the cervix. Her Pap smears have been normal since then.  Past medical history,surgical history, medications, allergies, family history and social history were all reviewed and documented in the EPIC chart. ROS:  Was performed and pertinent positives and negatives are included in the history.  Exam: chaperone present Filed Vitals:   10/21/10 1218  BP: 120/76   General appearance  Normal Skin grossly normal Head/Neck normal with no cervical or supraclavicular adenopathy thyroid normal Lungs  clear Cardiac RR, without RMG Abdominal  soft, nontender, without masses, organomegaly or hernia Breasts  examined lying and sitting without masses, retractions, discharge or axillary adenopathy. Pelvic  Ext/BUS/vagina  normal Pap of cuff done  Adnexa  Without masses or tenderness    Anus and perineum  normal   Rectovaginal  normal sphincter tone without palpated masses or tenderness.    Assessment/Plan:  45 y.o. female for annual exam.    1. History of AGUS Pap smear. 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 We'll continue with Pap of cuff though at present. I discussed possible decreased screening intervals in the future. 2. ERT. She's doing well on ERT but asked if she could decrease her dose. She is considering weaning this at some point. I discussed with her the issues of ERT and risks again to include the WHI study increased risk of stroke heart attack DVT possible breast cancer association, ACOG and NAMS statements her lowest dose shortness period of time. I also reviewed with her the issues of hypoestrogenism in a premenopausal patient in the issues of accelerated bone loss and accelerated cardiovascular risk. I recommended that we decrease her dose to  Vivelle dot.0.05 mg patch see how she does with this and then continue this for the next year. She agrees with the plan. 3. Health maintenance. Self breast exams on a monthly basis discussed in purge. She has not had a mammogram in 2 years as she knows she needs to schedule this and agrees to do so. No blood work was done today she reports all having been done through a work program with normal glucose blood count and lipid profile.  Assuming she continues well from a GYN standpoint she will see [place] in a year sooner as needed.    Dara Lords MD, 1:38 PM 10/21/2010

## 2011-05-08 ENCOUNTER — Encounter: Payer: Self-pay | Admitting: Gynecology

## 2011-11-01 ENCOUNTER — Other Ambulatory Visit (HOSPITAL_COMMUNITY)
Admission: RE | Admit: 2011-11-01 | Discharge: 2011-11-01 | Disposition: A | Discharge status: Home / Self Care | Payer: Managed Care, Other (non HMO) | Source: Ambulatory Visit | Attending: Gynecology | Admitting: Gynecology

## 2011-11-01 ENCOUNTER — Encounter: Payer: Self-pay | Admitting: Gynecology

## 2011-11-01 ENCOUNTER — Ambulatory Visit (INDEPENDENT_AMBULATORY_CARE_PROVIDER_SITE_OTHER): Payer: Managed Care, Other (non HMO) | Admitting: Gynecology

## 2011-11-01 VITALS — BP 116/70 | Ht 62.0 in | Wt 225.0 lb

## 2011-11-01 DIAGNOSIS — Z1151 Encounter for screening for human papillomavirus (HPV): Secondary | ICD-10-CM | POA: Insufficient documentation

## 2011-11-01 DIAGNOSIS — N898 Other specified noninflammatory disorders of vagina: Secondary | ICD-10-CM

## 2011-11-01 DIAGNOSIS — R5381 Other malaise: Secondary | ICD-10-CM

## 2011-11-01 DIAGNOSIS — Z13220 Encounter for screening for lipoid disorders: Secondary | ICD-10-CM

## 2011-11-01 DIAGNOSIS — Z1272 Encounter for screening for malignant neoplasm of vagina: Secondary | ICD-10-CM

## 2011-11-01 DIAGNOSIS — R5383 Other fatigue: Secondary | ICD-10-CM

## 2011-11-01 DIAGNOSIS — Z01419 Encounter for gynecological examination (general) (routine) without abnormal findings: Secondary | ICD-10-CM | POA: Insufficient documentation

## 2011-11-01 DIAGNOSIS — Z79890 Hormone replacement therapy (postmenopausal): Secondary | ICD-10-CM

## 2011-11-01 DIAGNOSIS — R635 Abnormal weight gain: Secondary | ICD-10-CM

## 2011-11-01 LAB — LIPID PANEL
HDL: 54 mg/dL (ref >39)
LDL Cholesterol: 121 mg/dL — ABNORMAL HIGH (ref 0–99)
Total CHOL/HDL Ratio: 3.6 Ratio

## 2011-11-01 LAB — TSH: TSH: 4.191 u[IU]/mL (ref 0.350–4.500)

## 2011-11-01 MED ORDER — ESTRADIOL 0.075 MG/24HR TD PTTW: MEDICATED_PATCH | TRANSDERMAL | Status: DC

## 2011-11-01 NOTE — Patient Instructions (Signed)
Follow-up for colposcopy appointment as scheduled. 

## 2011-11-01 NOTE — Progress Notes (Signed)
Mandy Bond January 17, 1965 [id number]        46 y.o.  G0P0 for annual exam.    Past medical history,surgical history, medications, allergies, family history and social history were all reviewed and documented in the EPIC chart. ROS:  Was performed and pertinent positives and negatives are included in the history.  Exam: Fleet Contras assistant Filed Vitals:   11/01/11 1404  BP: 116/70  Height: 5\' 2"  (1.575 m)  Weight: 225 lb (102.059 kg)   General appearance  Normal Skin grossly normal Head/Neck normal with no cervical or supraclavicular adenopathy thyroid normal Lungs  clear Cardiac RR, without RMG Abdominal  soft, nontender, without masses, organomegaly or hernia Breasts  examined lying and sitting without masses, retractions, discharge or axillary adenopathy. Pelvic  Ext/BUS/vagina  Cluster of submucosal nodularity white in color anterior cuff. Pap done over this area.  Adnexa  Without masses or tenderness    Anus and perineum  normal   Rectovaginal  normal sphincter tone without palpated masses or tenderness.    Assessment/Plan:  46 y.o. G0P0 female for annual exam.   1. White nodularity upper vaginal cuff. Never noticed before. Questionable scarring from hysterectomy versus new finding. Submucosal cyst, rule out endometriosis.  Pap done over the area of recommendation follow up for colposcopy to take a closer look and possible biopsy. Patient agrees with this and will schedule. 2. Fatigue weight gain. Sister has Hashimoto's thyroiditis. We'll check thyroid levels and antithyroid antibodies. Recently had blood work to include blood count and comprehensive metabolic panel reportedly normal for her primary. 3. ERT. Patient tell Vivelle 0.075 patch. Doing well. I again reviewed the WHI study, increased risk of stroke heart attack DVT possible breast cancer issues. Patient understands accepts wants to continue I refilled her times a year. I discussed the game plan to include continuing  through the expected menopausal timeframe is in the early 50s and then consider discontinuing it at that time she is comfortable with this. 4. Pap smear. Pap over the nodular area at the vaginal cuff done.  Does have history of AGUS but was felt that this was probably due to cervical endometriosis at the time and she has had normal follow up Pap smears. 5. Mammography.  Patient had may/2013. Continued annual mammography. SBE monthly reviewed. 6. Health maintenance. Lipid profile done as it was not done at her primary but otherwise no other blood work done. Follow up for thyroid results and colposcopy. Otherwise follow up annually. 7.     Dara Lords MD, 2:37 PM 11/01/2011

## 2011-11-01 NOTE — Addendum Note (Signed)
Addended by: Richardson Chiquito on: 11/01/2011 02:43 PM   Modules accepted: Orders

## 2011-11-02 LAB — URINALYSIS W MICROSCOPIC + REFLEX CULTURE
Bacteria, UA: NONE SEEN
Bilirubin Urine: NEGATIVE
Crystals: NONE SEEN
Glucose, UA: NEGATIVE mg/dL
Ketones, ur: NEGATIVE mg/dL
Leukocytes, UA: NEGATIVE
Nitrite: NEGATIVE
Protein, ur: NEGATIVE mg/dL
Squamous Epithelial / LPF: NONE SEEN

## 2011-11-02 LAB — THYROID ANTIBODIES: Thyroglobulin Ab: <20 U/mL (ref <40.0)

## 2011-11-13 ENCOUNTER — Telehealth: Payer: Self-pay | Admitting: Gynecology

## 2011-11-13 NOTE — Telephone Encounter (Signed)
I received fax from pharmacy saying that they had RX for Vivelle Dot 0.05mg  patch but patient was unaware of a change in dose.  It does appear in system that Dr. Velvet Bathe planned for her to continue on her current does on 0.075 but when he e-scribed it the directions read s: apply Vivelle Dot 0.05 mg patch twice weekly.  I faxed back a note letting them know that she was presribed Vivelle Dot 0.075mg  patch and that is indeed correct.

## 2011-11-14 ENCOUNTER — Encounter: Payer: Self-pay | Admitting: Gynecology

## 2011-11-14 ENCOUNTER — Ambulatory Visit (INDEPENDENT_AMBULATORY_CARE_PROVIDER_SITE_OTHER): Payer: Managed Care, Other (non HMO) | Admitting: Gynecology

## 2011-11-14 DIAGNOSIS — N898 Other specified noninflammatory disorders of vagina: Secondary | ICD-10-CM

## 2011-11-14 NOTE — Patient Instructions (Signed)
Colposcopy Care After Colposcopy is a procedure in which a special tool is used to magnify the surface of the cervix or in your case the vagina. A tissue sample (biopsy) may also be taken. This sample will be looked at for vaginal cancer or other problems. After the test:  You may have some cramping.  Lie down for a few minutes if you feel lightheaded.   You may have some bleeding which should stop in a few days. HOME CARE  Do not have sex or use tampons for 2 to 3 days or as told.  Only take medicine as told by your doctor.  Continue to take your birth control pills as usual. Finding out the results of your test Ask when your test results will be ready. Make sure you get your test results. GET HELP RIGHT AWAY IF:  You are bleeding a lot or are passing blood clots.  You develop a fever of 102 F (38.9 C) or higher.  You have abnormal vaginal discharge.  You have cramps that do not go away with medicine.  You feel lightheaded, dizzy, or pass out (faint). MAKE SURE YOU:   Understand these instructions.  Will watch your condition.  Will get help right away if you are not doing well or get worse. Document Released: 06/07/2007 Document Revised: 03/13/2011 Document Reviewed: 06/07/2007 [facility] Patient Information 2013 [place], [place].

## 2011-11-14 NOTE — Progress Notes (Signed)
Patient presents for colposcopy of the upper vagina. 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 On annual exam she had a cluster of small white appearing vaginal submucosal cysts upper vaginal cuff midline and was asked to represent for colposcopy for better definition. Pap smear of the upper cuff was taken which was normal.  Exam with bone assistant Pelvic external BUS vagina with cluster of whitish small cysts upper mid cuff. Bimanual without masses or tenderness.  Colposcopy after acetic acid cleanse shows benign appearing cluster of small white submucosal cysts. Subsequently 2 of the areas were biopsied with sebaceous type material extruding. Biopsy specimen sent to pathology. Silver nitrate applied.  Assessment and plan:  Probable small stitch granulomas/submucosal vaginal cysts. Benign in appearance. 2 were drained. Several small ones remaining. Patient will follow up her biopsy results. As long as benign we will follow as she is asymptomatic with recommended annual follow up.  Options to try to biopsy/drain all of her were reviewed a tear in the folds of her upper vagina and this is uncomfortable manipulation for her and felt unnecessary assuming that the representative biopsies times several are normal.

## 2011-11-20 ENCOUNTER — Encounter: Payer: Self-pay | Admitting: Gynecology

## 2011-11-20 ENCOUNTER — Telehealth: Payer: Self-pay | Admitting: Gynecology

## 2011-11-20 NOTE — Telephone Encounter (Signed)
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 She does have history of AGUS previously but was felt to be due to endometriosis of the cervix. Did not have visual vaginal changes on colposcopic exam to suggest dysplasia. Recommended patient follow up in 6 months for recolposcopy given the unusual results and will follow up on that.

## 2011-11-21 NOTE — Telephone Encounter (Signed)
PT IS SCHEDULED FOR COLPOSCOPY ON May 13, 2012.

## 2011-11-22 ENCOUNTER — Encounter: Payer: Self-pay | Admitting: Gynecology

## 2012-02-17 ENCOUNTER — Other Ambulatory Visit: Payer: Self-pay

## 2012-05-03 ENCOUNTER — Encounter: Payer: Self-pay | Admitting: Gynecology

## 2012-05-13 ENCOUNTER — Ambulatory Visit: Payer: Managed Care, Other (non HMO) | Admitting: Gynecology

## 2012-11-01 ENCOUNTER — Telehealth: Payer: Self-pay | Admitting: *Deleted

## 2012-11-01 ENCOUNTER — Ambulatory Visit (INDEPENDENT_AMBULATORY_CARE_PROVIDER_SITE_OTHER): Payer: Managed Care, Other (non HMO) | Admitting: Gynecology

## 2012-11-01 ENCOUNTER — Encounter: Payer: Self-pay | Admitting: Gynecology

## 2012-11-01 ENCOUNTER — Other Ambulatory Visit (HOSPITAL_COMMUNITY)
Admission: RE | Admit: 2012-11-01 | Discharge: 2012-11-01 | Disposition: A | Discharge status: Home / Self Care | Payer: Managed Care, Other (non HMO) | Source: Ambulatory Visit | Attending: Gynecology | Admitting: Gynecology

## 2012-11-01 VITALS — BP 116/74 | Ht 62.0 in | Wt 227.0 lb

## 2012-11-01 DIAGNOSIS — Z79890 Hormone replacement therapy (postmenopausal): Secondary | ICD-10-CM

## 2012-11-01 DIAGNOSIS — N890 Mild vaginal dysplasia: Secondary | ICD-10-CM

## 2012-11-01 DIAGNOSIS — Z01419 Encounter for gynecological examination (general) (routine) without abnormal findings: Secondary | ICD-10-CM | POA: Insufficient documentation

## 2012-11-01 DIAGNOSIS — N893 Dysplasia of vagina, unspecified: Secondary | ICD-10-CM

## 2012-11-01 LAB — CBC WITH DIFFERENTIAL/PLATELET
Basophils Relative: 0 % (ref 0–1)
Lymphs Abs: 3 10*3/uL (ref 0.7–4.0)
MCHC: 34.5 g/dL (ref 30.0–36.0)
MCV: 88.1 fL (ref 78.0–100.0)
Monocytes Absolute: 0.5 10*3/uL (ref 0.1–1.0)
Monocytes Relative: 6 % (ref 3–12)
Platelets: 291 10*3/uL (ref 150–400)
RDW: 14.4 % (ref 11.5–15.5)

## 2012-11-01 LAB — COMPREHENSIVE METABOLIC PANEL
ALT: 26 U/L (ref 0–35)
AST: 17 U/L (ref 0–37)
Albumin: 4.3 g/dL (ref 3.5–5.2)
Alkaline Phosphatase: 92 U/L (ref 39–117)
BUN: 15 mg/dL (ref 6–23)
CO2: 26 mEq/L (ref 19–32)
Calcium: 9.5 mg/dL (ref 8.4–10.5)
Glucose, Bld: 97 mg/dL (ref 70–99)
Total Bilirubin: 0.7 mg/dL (ref 0.3–1.2)

## 2012-11-01 LAB — LIPID PANEL
LDL Cholesterol: 93 mg/dL (ref 0–99)
Triglycerides: 100 mg/dL (ref <150)

## 2012-11-01 MED ORDER — ESTRADIOL 0.075 MG/24HR TD PTTW: MEDICATED_PATCH | TRANSDERMAL | Status: DC

## 2012-11-01 NOTE — Patient Instructions (Signed)
Follow-up for colposcopy appointment as scheduled. 

## 2012-11-01 NOTE — Telephone Encounter (Signed)
Pt Rx for vivelle-dot patch 0.075 mg should have been sent to mail order, rather than local pharmacy. Rx will be sent to [place] [facility] deliver.

## 2012-11-01 NOTE — Progress Notes (Signed)
Mandy Bond 28-May-1965 [id number]        47 y.o.  G0P0 for annual exam.  Several issues noted below.  Past medical history,surgical history, problem list, medications, allergies, family history and social history were all reviewed and documented in the EPIC chart.  ROS:  Performed and pertinent positives and negatives are included in the history, assessment and plan .  Exam: Kim assistant Filed Vitals:   11/01/12 0933  BP: 116/74  Height: 5\' 2"  (1.575 m)  Weight: 227 lb (102.967 kg)   General appearance  Normal Skin grossly normal Head/Neck normal with no cervical or supraclavicular adenopathy thyroid normal Lungs  clear Cardiac RR, without RMG Abdominal  soft, nontender, without masses, organomegaly or hernia Breasts  examined lying and sitting without masses, retractions, discharge or axillary adenopathy. Pelvic  Ext/BUS/vagina  grossly normal, Pap of cuff done  Adnexa  Without masses or tenderness    Anus and perineum  normal   Rectovaginal  normal sphincter tone without palpated masses or tenderness.    Assessment/Plan:  47 y.o. G0P0 female for annual exam.   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 Returned for colposcopy which showed a cluster of submucosal cyst like area. 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 Pap smear to same time was negative with negative high risk HPV. Her Pap smears were normal over the last several years.  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  She was supposed to followup at 6 months for repeat colposcopy but never did that. Recommend now and she agrees to schedule. No evidence on gross examination today of the white nodules. Pap was done today. 2. Status post TAH/BSO 2009 for endometriosis. Currently on Vivelle 0.075 patch is doing well.  I again reviewed the whole issue of HRT with her to include the WHI  study with increased risk of stroke, heart attack, DVT and breast cancer. The ACOG and NAMS statements for lowest dose for the shortest period of time reviewed. Transdermal versus oral first-pass effect benefit discussed. Issue as to whether this all applies to her being in her 40s status post BSO and the potential benefits of cardiovascular protection and bone health. Patient wants to continue and I refilled her 0.075 mg patches x1 year. 3. Mammography 05/2012. Continue with annual mammography. SBE monthly reviewed. 4.  health maintenance. Baseline CBC comprehensive metabolic panel lipid profile urinalysis ordered. Followup for colposcopy as scheduled.   Note: This document was prepared with digital dictation and possible smart phrase technology. Any transcriptional errors that result from this process are unintentional.   Dara Lords MD, 10:03 AM 11/01/2012

## 2012-11-01 NOTE — Addendum Note (Signed)
Addended by: Dayna Barker on: 11/01/2012 10:15 AM   Modules accepted: Orders

## 2012-11-02 LAB — URINALYSIS W MICROSCOPIC + REFLEX CULTURE
Bilirubin Urine: NEGATIVE
Glucose, UA: NEGATIVE mg/dL
Hgb urine dipstick: NEGATIVE
Leukocytes, UA: NEGATIVE
Protein, ur: NEGATIVE mg/dL
Specific Gravity, Urine: 1.009 (ref 1.005–1.030)
pH: 7.5 (ref 5.0–8.0)

## 2012-11-04 ENCOUNTER — Other Ambulatory Visit: Payer: Self-pay | Admitting: Gynecology

## 2012-11-04 ENCOUNTER — Other Ambulatory Visit: Payer: Self-pay

## 2012-11-04 ENCOUNTER — Telehealth: Payer: Self-pay

## 2012-11-04 LAB — URINE CULTURE: Colony Count: >=100000

## 2012-11-04 MED ORDER — SULFAMETHOXAZOLE-TMP DS 800-160 MG PO TABS: 1.0000 | ORAL_TABLET | Freq: Two times a day (BID) | ORAL | Status: DC

## 2012-11-04 MED ORDER — ESTRADIOL 0.075 MG/24HR TD PTTW: 1.0000 | MEDICATED_PATCH | TRANSDERMAL | Status: DC

## 2012-11-04 NOTE — Telephone Encounter (Signed)
"  We have received the attached prescription for your patient."  Electronic Rx sent for "Vivelle dot 0.075mg , but directions say Vivelle Dot 0.05mg  twice weekly.  Patient has history of 0.075mg . Please advise which strength is correct."  Your office note states 0.075.

## 2012-11-04 NOTE — Telephone Encounter (Signed)
0.075 mg patches

## 2012-11-07 ENCOUNTER — Encounter: Payer: Self-pay | Admitting: Gynecology

## 2012-11-07 ENCOUNTER — Other Ambulatory Visit: Payer: Self-pay

## 2012-11-07 ENCOUNTER — Ambulatory Visit (INDEPENDENT_AMBULATORY_CARE_PROVIDER_SITE_OTHER): Payer: Managed Care, Other (non HMO) | Admitting: Gynecology

## 2012-11-07 DIAGNOSIS — N890 Mild vaginal dysplasia: Secondary | ICD-10-CM

## 2012-11-07 DIAGNOSIS — N893 Dysplasia of vagina, unspecified: Secondary | ICD-10-CM

## 2012-11-07 NOTE — Patient Instructions (Signed)
Office will call you with the biopsy results 

## 2012-11-07 NOTE — Progress Notes (Signed)
Patient presents for followup colposcopy. History of routine exam last year where visually she had what appeared to be a cluster of small submucosal cyst at the top of the vaginal cuff. She had colposcopy which again appeared to be submucosal cyst. A representative biopsy returned VAIN 1. She was to return for repeat colposcopy at 6 months but did not followup for this. She does have a history of AGUS previously that was felt to be ultimately due to cervical endometriosis. Her most recent exam was grossly normal with Pap smear negative. She presents now for followup colposcopy.  Exam was Administrator, [employer] vagina normal.  Colposcopy of the vagina showed one small submucosal cyst mid upper cuff. Biopsy taken with mucous type cyst fluid extruding. Biopsy sent to pathology.  Assessment and plan: VAIN 11 year ago. Most recent Pap smear negative. Patient will followup for biopsy results. Assuming normal or low-grade we'll plan expectant management if otherwise then we'll triage based upon results.

## 2012-11-11 ENCOUNTER — Encounter: Payer: Self-pay | Admitting: Gynecology

## 2013-10-21 ENCOUNTER — Other Ambulatory Visit: Payer: Self-pay | Admitting: Gynecology

## 2013-11-13 ENCOUNTER — Encounter: Payer: Self-pay | Admitting: Gynecology

## 2013-11-13 ENCOUNTER — Ambulatory Visit (INDEPENDENT_AMBULATORY_CARE_PROVIDER_SITE_OTHER): Payer: Managed Care, Other (non HMO) | Admitting: Gynecology

## 2013-11-13 ENCOUNTER — Other Ambulatory Visit (HOSPITAL_COMMUNITY)
Admission: RE | Admit: 2013-11-13 | Discharge: 2013-11-13 | Disposition: A | Discharge status: Home / Self Care | Payer: Managed Care, Other (non HMO) | Source: Ambulatory Visit | Attending: Gynecology | Admitting: Gynecology

## 2013-11-13 VITALS — BP 120/80 | Ht 62.0 in | Wt 222.0 lb

## 2013-11-13 DIAGNOSIS — Z79890 Hormone replacement therapy (postmenopausal): Secondary | ICD-10-CM

## 2013-11-13 DIAGNOSIS — Z01411 Encounter for gynecological examination (general) (routine) with abnormal findings: Secondary | ICD-10-CM | POA: Insufficient documentation

## 2013-11-13 DIAGNOSIS — N893 Dysplasia of vagina, unspecified: Secondary | ICD-10-CM

## 2013-11-13 DIAGNOSIS — Z01419 Encounter for gynecological examination (general) (routine) without abnormal findings: Secondary | ICD-10-CM

## 2013-11-13 MED ORDER — ESTRADIOL 0.075 MG/24HR TD PTTW: MEDICATED_PATCH | TRANSDERMAL | Status: DC

## 2013-11-13 NOTE — Patient Instructions (Signed)
Call to Schedule your mammogram  Facilities in [place]: 1)  [facility], [address] Phone: [phone] 2)  [facility]. [address] [place] Phone: [phone] 3)  Dr. Isaiah Blakes at [facility] [address] Phone: [phone]     Mammogram A mammogram is an X-ray test to find changes in a woman's breast. You should get a mammogram if:  You are 50 years of age or older  You have risk factors.   Your doctor recommends that you have one.  BEFORE THE TEST  Do not schedule the test the week before your period, especially if your breasts are sore during this time.  On the day of your mammogram:  Wash your breasts and armpits well. After washing, do not put on any deodorant or talcum powder on until after your test.   Eat and drink as you usually do.   Take your medicines as usual.   If you are diabetic and take insulin, make sure you:   Eat before coming for your test.   Take your insulin as usual.   If you cannot keep your appointment, call before the appointment to cancel. Schedule another appointment.  TEST  You will need to undress from the waist up. You will put on a hospital gown.   Your breast will be put on the mammogram machine, and it will press firmly on your breast with a piece of plastic called a compression paddle. This will make your breast flatter so that the machine can X-ray all parts of your breast.   Both breasts will be X-rayed. Each breast will be X-rayed from above and from the side. An X-ray might need to be taken again if the picture is not good enough.   The mammogram will last about 15 to 30 minutes.  AFTER THE TEST Finding out the results of your test Ask when your test results will be ready. Make sure you get your test results.  Document Released: 03/17/2008 Document Revised: 12/08/2010 Document Reviewed: 03/17/2008 [facility] Patient  Information 2012 [place].  You may obtain a copy of any labs that were done today by logging onto MyChart as outlined in the instructions provided with your AVS (after visit summary). The office will not call with normal lab results but certainly if there are any significant abnormalities then we will contact you.   Health Maintenance, Female A healthy lifestyle and preventative care can promote health and wellness.  Maintain regular health, dental, and eye exams.  Eat a healthy diet. Foods like vegetables, fruits, whole grains, low-fat dairy products, and lean protein foods contain the nutrients you need without too many calories. Decrease your intake of foods high in solid fats, added sugars, and salt. Get information about a proper diet from your caregiver, if necessary.  Regular physical exercise is one of the most important things you can do for your health. Most adults should get at least 150 minutes of moderate-intensity exercise (any activity that increases your heart rate and causes you to sweat) each week. In addition, most adults need muscle-strengthening exercises on 2 or more days a week.   Maintain a healthy weight. The body mass index (BMI) is a screening tool to identify possible weight problems. It provides an estimate of body fat based on height and weight. Your caregiver can help determine your BMI, and can help you achieve or maintain a healthy weight. For adults  20 years and older:  A BMI below 18.5 is considered underweight.  A BMI of 18.5 to 24.9 is normal.  A BMI of 25 to 29.9 is considered overweight.  A BMI of 30 and above is considered obese.  Maintain normal blood lipids and cholesterol by exercising and minimizing your intake of saturated fat. Eat a balanced diet with plenty of fruits and vegetables. Blood tests for lipids and cholesterol should begin at age 20 and be repeated every 5 years. If your lipid or cholesterol levels are high, you are over 50, or  you are a high risk for heart disease, you may need your cholesterol levels checked more frequently.Ongoing high lipid and cholesterol levels should be treated with medicines if diet and exercise are not effective.  If you smoke, find out from your caregiver how to quit. If you do not use tobacco, do not start.  Lung cancer screening is recommended for adults aged 55 80 years who are at high risk for developing lung cancer because of a history of smoking. Yearly low-dose computed tomography (CT) is recommended for people who have at least a 30-pack-year history of smoking and are a current smoker or have quit within the past 15 years. A pack year of smoking is smoking an average of 1 pack of cigarettes a day for 1 year (for example: 1 pack a day for 30 years or 2 packs a day for 15 years). Yearly screening should continue until the smoker has stopped smoking for at least 15 years. Yearly screening should also be stopped for people who develop a health problem that would prevent them from having lung cancer treatment.  If you are pregnant, do not drink alcohol. If you are breastfeeding, be very cautious about drinking alcohol. If you are not pregnant and choose to drink alcohol, do not exceed 1 drink per day. One drink is considered to be 12 ounces (355 mL) of beer, 5 ounces (148 mL) of wine, or 1.5 ounces (44 mL) of liquor.  Avoid use of street drugs. Do not share needles with anyone. Ask for help if you need support or instructions about stopping the use of drugs.  High blood pressure causes heart disease and increases the risk of stroke. Blood pressure should be checked at least every 1 to 2 years. Ongoing high blood pressure should be treated with medicines, if weight loss and exercise are not effective.  If you are 55 to 50 years old, ask your caregiver if you should take aspirin to prevent strokes.  Diabetes screening involves taking a blood sample to check your fasting blood sugar level. This  should be done once every 3 years, after age 45, if you are within normal weight and without risk factors for diabetes. Testing should be considered at a younger age or be carried out more frequently if you are overweight and have at least 1 risk factor for diabetes.  Breast cancer screening is essential preventative care for women. You should practice "breast self-awareness." This means understanding the normal appearance and feel of your breasts and may include breast self-examination. Any changes detected, no matter how small, should be reported to a caregiver. Women in their 20s and 30s should have a clinical breast exam (CBE) by a caregiver as part of a regular health exam every 1 to 3 years. After age 40, women should have a CBE every year. Starting at age 40, women should consider having a mammogram (breast X-ray) every year. Women who have a   family history of breast cancer should talk to their caregiver about genetic screening. Women at a high risk of breast cancer should talk to their caregiver about having an MRI and a mammogram every year.  Breast cancer gene (BRCA)-related cancer risk assessment is recommended for women who have family members with BRCA-related cancers. BRCA-related cancers include breast, ovarian, tubal, and peritoneal cancers. Having family members with these cancers may be associated with an increased risk for harmful changes (mutations) in the breast cancer genes BRCA1 and BRCA2. Results of the assessment will determine the need for genetic counseling and BRCA1 and BRCA2 testing.  The Pap test is a screening test for cervical cancer. Women should have a Pap test starting at age 33. Between ages 75 and 14, Pap tests should be repeated every 2 years. Beginning at age 4, you should have a Pap test every 3 years as long as the past 3 Pap tests have been normal. If you had a hysterectomy for a problem that was not cancer or a condition that could lead to cancer, then you no longer  need Pap tests. If you are between ages 72 and 12, and you have had normal Pap tests going back 10 years, you no longer need Pap tests. If you have had past treatment for cervical cancer or a condition that could lead to cancer, you need Pap tests and screening for cancer for at least 20 years after your treatment. If Pap tests have been discontinued, risk factors (such as a new sexual partner) need to be reassessed to determine if screening should be resumed. Some women have medical problems that increase the chance of getting cervical cancer. In these cases, your caregiver may recommend more frequent screening and Pap tests.  The human papillomavirus (HPV) test is an additional test that may be used for cervical cancer screening. The HPV test looks for the virus that can cause the cell changes on the cervix. The cells collected during the Pap test can be tested for HPV. The HPV test could be used to screen women aged 84 years and older, and should be used in women of any age who have unclear Pap test results. After the age of 73, women should have HPV testing at the same frequency as a Pap test.  Colorectal cancer can be detected and often prevented. Most routine colorectal cancer screening begins at the age of 56 and continues through age 76. However, your caregiver may recommend screening at an earlier age if you have risk factors for colon cancer. On a yearly basis, your caregiver may provide home test kits to check for hidden blood in the stool. Use of a small camera at the end of a tube, to directly examine the colon (sigmoidoscopy or colonoscopy), can detect the earliest forms of colorectal cancer. Talk to your caregiver about this at age 20, when routine screening begins. Direct examination of the colon should be repeated every 5 to 10 years through age 83, unless early forms of pre-cancerous polyps or small growths are found.  Hepatitis C blood testing is recommended for all people born from [birth year]  through 1965 and any individual with known risks for hepatitis C.  Practice safe sex. Use condoms and avoid high-risk sexual practices to reduce the spread of sexually transmitted infections (STIs). Sexually active women aged 28 and younger should be checked for Chlamydia, which is a common sexually transmitted infection. Older women with new or multiple partners should also be tested for Chlamydia. Testing for  other STIs is recommended if you are sexually active and at increased risk.  Osteoporosis is a disease in which the bones lose minerals and strength with aging. This can result in serious bone fractures. The risk of osteoporosis can be identified using a bone density scan. Women ages 35 and over and women at risk for fractures or osteoporosis should discuss screening with their caregivers. Ask your caregiver whether you should be taking a calcium supplement or vitamin D to reduce the rate of osteoporosis.  Menopause can be associated with physical symptoms and risks. Hormone replacement therapy is available to decrease symptoms and risks. You should talk to your caregiver about whether hormone replacement therapy is right for you.  Use sunscreen. Apply sunscreen liberally and repeatedly throughout the day. You should seek shade when your shadow is shorter than you. Protect yourself by wearing long sleeves, pants, a wide-brimmed hat, and sunglasses year round, whenever you are outdoors.  Notify your caregiver of new moles or changes in moles, especially if there is a change in shape or color. Also notify your caregiver if a mole is larger than the size of a pencil eraser.  Stay current with your immunizations. Document Released: 07/04/2010 Document Revised: 04/15/2012 Document Reviewed: 07/04/2010 [facility] Patient Information 2014 [place].

## 2013-11-13 NOTE — Addendum Note (Signed)
Addended by: Nelva Nay on: 11/13/2013 10:58 AM   Modules accepted: Orders, SmartSet

## 2013-11-13 NOTE — Progress Notes (Signed)
Mandy Bond 1965-04-17 [id number]        48 y.o.  G0P0 for annual exam.  Doing well without complaints.  Past medical history,surgical history, problem list, medications, allergies, family history and social history were all reviewed and documented as reviewed in the EPIC chart.  ROS:  12 system ROS performed with pertinent positives and negatives included in the history, assessment and plan.   Additional significant findings :  none   Exam: Kim Counsellor Vitals:   11/13/13 0955  BP: 120/80  Height: 5\' 2"  (1.575 m)  Weight: 222 lb (100.699 kg)   General appearance:  Normal affect, orientation and appearance. Skin: Grossly normal HEENT: Without gross lesions.  No cervical or supraclavicular adenopathy. Thyroid normal.  Lungs:  Clear without wheezing, rales or rhonchi Cardiac: RR, without RMG Abdominal:  Soft, nontender, without masses, guarding, rebound, organomegaly or hernia Breasts:  Examined lying and sitting without masses, retractions, discharge or axillary adenopathy. Pelvic:  Ext/BUS/vagina normal.  Pap smear of vaginal cuff done  Adnexa  Without masses or tenderness    Anus and perineum  Normal   Rectovaginal  Normal sphincter tone without palpated masses or tenderness.    Assessment/Plan:  48 y.o. G0P0 female for annual exam.   1. Status post TAH/BSO for endometriosis.  On ERT of Vivelle 0.075 doing well. Reviewed again the issues of HRT to include the WHI study with increased risk of stroke heart attack DVT and breast cancer. Use and a surgical menopause younger woman issues and potential benefits to include cardiovascular and bone health reviewed. Patient wants to continue and I refilled her 1 year. 2. History of VAIN 1. Had visible cyst like areas on vaginal cuff 2013 and 2014 both biopsies showed VAIN 1. Pap smear 2014 normal. Pap smear done today. No visible lesions noted on exam. Assuming Pap smear normal the and plan annual cytology for  now. 3. Mammography overdue. Patient knows and plans to schedule. SBE monthly reviewed. 4. Health maintenance. Patient reports having routine blood work done in July. Follow up in one year, sooner as needed.     Anastasio Auerbach MD, 10:15 AM 11/13/2013

## 2013-11-14 LAB — URINALYSIS W MICROSCOPIC + REFLEX CULTURE
Bilirubin Urine: NEGATIVE
CASTS: NONE SEEN
Crystals: NONE SEEN
Glucose, UA: NEGATIVE mg/dL
HGB URINE DIPSTICK: NEGATIVE
Ketones, ur: NEGATIVE mg/dL
LEUKOCYTES UA: NEGATIVE
NITRITE: NEGATIVE
PH: 6.5 (ref 5.0–8.0)
Protein, ur: NEGATIVE mg/dL
Specific Gravity, Urine: <1.005 — ABNORMAL LOW (ref 1.005–1.030)
Squamous Epithelial / LPF: NONE SEEN
UROBILINOGEN UA: 0.2 mg/dL (ref 0.0–1.0)

## 2013-11-14 LAB — CYTOLOGY - PAP

## 2014-01-11 ENCOUNTER — Emergency Department (HOSPITAL_BASED_OUTPATIENT_CLINIC_OR_DEPARTMENT_OTHER)
Admission: EM | Admit: 2014-01-11 | Discharge: 2014-01-11 | Disposition: A | Discharge status: Home / Self Care | Payer: Managed Care, Other (non HMO) | Attending: Emergency Medicine | Admitting: Emergency Medicine

## 2014-01-11 ENCOUNTER — Encounter (HOSPITAL_BASED_OUTPATIENT_CLINIC_OR_DEPARTMENT_OTHER): Payer: Self-pay | Admitting: *Deleted

## 2014-01-11 DIAGNOSIS — Z8639 Personal history of other endocrine, nutritional and metabolic disease: Secondary | ICD-10-CM | POA: Diagnosis not present

## 2014-01-11 DIAGNOSIS — Z87448 Personal history of other diseases of urinary system: Secondary | ICD-10-CM | POA: Diagnosis not present

## 2014-01-11 DIAGNOSIS — Z79899 Other long term (current) drug therapy: Secondary | ICD-10-CM | POA: Diagnosis not present

## 2014-01-11 DIAGNOSIS — Z8709 Personal history of other diseases of the respiratory system: Secondary | ICD-10-CM | POA: Insufficient documentation

## 2014-01-11 DIAGNOSIS — M545 Low back pain: Secondary | ICD-10-CM | POA: Insufficient documentation

## 2014-01-11 DIAGNOSIS — Z792 Long term (current) use of antibiotics: Secondary | ICD-10-CM | POA: Insufficient documentation

## 2014-01-11 DIAGNOSIS — M549 Dorsalgia, unspecified: Secondary | ICD-10-CM

## 2014-01-11 MED ORDER — DIAZEPAM 5 MG PO TABS
5.0000 mg | ORAL_TABLET | Freq: Once | ORAL | Status: AC
  Administered 2014-01-11: 5 mg via ORAL
  Filled 2014-01-11: qty 1

## 2014-01-11 MED ORDER — HYDROCODONE-ACETAMINOPHEN 5-325 MG PO TABS: 1.0000 | ORAL_TABLET | ORAL | Status: DC | PRN

## 2014-01-11 MED ORDER — DIAZEPAM 5 MG PO TABS: 5.0000 mg | ORAL_TABLET | Freq: Three times a day (TID) | ORAL | Status: DC | PRN

## 2014-01-11 MED ORDER — HYDROMORPHONE HCL 1 MG/ML IJ SOLN
1.0000 mg | Freq: Once | INTRAMUSCULAR | Status: AC
  Administered 2014-01-11: 1 mg via INTRAMUSCULAR
  Filled 2014-01-11: qty 1

## 2014-01-11 NOTE — ED Provider Notes (Signed)
CSN: [id number]     Arrival date & time 01/11/14  9702 History   First MD Initiated Contact with Patient 01/11/14 [phone]     Chief Complaint  Patient presents with  . Back Pain     (Consider location/radiation/quality/duration/timing/severity/associated sxs/prior Treatment) HPI Comments: 49 year old female with no significant medical history presents with lower back pain since moving boxes yesterday. Worse when putting the box back down. Worse with movement and position. Patient has had this in the past however not recently.Patient denies urinary or bowel changes, active cancer, extremity weakness, IVDU, fevers, immunosuppression or significant trauma. Non radiating pain, Ache.    Patient is a 49 y.o. female presenting with back pain. The history is provided by the patient.  Back Pain Associated symptoms: no abdominal pain, no chest pain, no dysuria, no fever, no headaches, no numbness and no weakness     Past Medical History  Diagnosis Date  . Endometriosis 12/18/07    STAGE 4--TAH, BSO  . Seasonal allergies   . Hypothyroid   . AGUS favor benign 2007    w/u suggested endometriosis of cervix  . 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 Cholecystectomy    . Neurectomy foot    . Pelvic laparoscopy      DIAG LAP/STAGE 4 ENDOMETRIOSIS  . Abdominal hysterectomy  12/2007    TAH,BSO  . Dilation and curettage of uterus    . Tonsillectomy    . Hysteroscopy  12/2000, 08/2005    AND D&C  . Oophorectomy      BSO  . Knee surgery     Family History  Problem Relation Age of Onset  . Breast cancer Mother 21  . Diabetes Mother   . Diabetes Sister   . Hashimoto's thyroiditis Sister   . Breast cancer Maternal Aunt     50's  . Cancer Father     Lung cancer   History  Substance Use Topics  . Smoking status: Never Smoker   . Smokeless tobacco: Not on file   . Alcohol Use: 0.5 oz/week    1 drink(s) per week     Comment: occassional   OB History    Gravida Para Term Preterm AB TAB SAB Ectopic Multiple Living   0              Review of Systems  Constitutional: Negative for fever and chills.  HENT: Negative for congestion.   Eyes: Negative for visual disturbance.  Respiratory: Negative for shortness of breath.   Cardiovascular: Negative for chest pain.  Gastrointestinal: Negative for vomiting and abdominal pain.  Genitourinary: Negative for dysuria and flank pain.  Musculoskeletal: Positive for back pain. Negative for neck pain and neck stiffness.  Skin: Negative for rash.  Neurological: Negative for weakness, light-headedness, numbness and headaches.      Allergies  Macrobid; Neomycin; and Vicodin  Home Medications   Prior to Admission medications   Medication Sig Start Date End Date Taking? Authorizing Provider  diazepam (VALIUM) 5 MG tablet Take 1 tablet (5 mg total) by mouth every 8 (eight) hours as needed for anxiety. 01/11/14   Mariea Clonts, MD  doxycycline (VIBRAMYCIN) 100 MG capsule Take 100 mg by mouth 2 (two) times daily.    Historical Provider, MD  estradiol (VIVELLE-DOT) 0.075 MG/24HR APPLY ONE PATCH ONTO THE SKIN TWICE WEEKLY 11/13/13   Timothy P  Fontaine, MD  HYDROcodone-acetaminophen (NORCO) 5-325 MG per tablet Take 1-2 tablets by mouth every 4 (four) hours as needed. 01/11/14   Mariea Clonts, MD  Loratadine (CLARITIN PO) Take by mouth.      Historical Provider, MD  Multiple Vitamins-Iron (MULTIVITAMIN/IRON PO) Take by mouth.      Historical Provider, MD  Omeprazole (PRILOSEC PO) Take by mouth.      Historical Provider, MD  phentermine 15 MG capsule Take 15 mg by mouth every morning.    Historical Provider, MD   BP 123/67 mmHg  Pulse 66  Temp(Src) 98.4 F (36.9 C) (Oral)  Resp 20  Ht 5\' 2"  (1.575 m)  Wt 220 lb (99.791 kg)  BMI 40.23 kg/m2  SpO2 99% Physical Exam  Constitutional: She is oriented to  person, place, and time. She appears well-developed and well-nourished.  HENT:  Head: Normocephalic and atraumatic.  Eyes: Conjunctivae are normal. Right eye exhibits no discharge. Left eye exhibits no discharge.  Neck: Normal range of motion. Neck supple. No tracheal deviation present.  Cardiovascular: Normal rate and regular rhythm.   Pulmonary/Chest: Effort normal.  Abdominal: Soft.  Musculoskeletal: She exhibits tenderness. She exhibits no edema.  Patient has mild to moderate tenderness with rotation and palpation upper gluteal and left paraspinal lower lumbar. No midline tenderness  Neurological: She is alert and oriented to person, place, and time.  Reflex Scores:      Patellar reflexes are 2+ on the right side and 2+ on the left side.      Achilles reflexes are 1+ on the right side and 1+ on the left side. 5+ strength in  LE with f/e at major joints. Sensation to palpation intact in and LE.    Skin: Skin is warm. No rash noted.  Psychiatric: She has a normal mood and affect.  Nursing note and vitals reviewed.   ED Course  Procedures (including critical care time) Labs Review Labs Reviewed - No data to display  Imaging Review No results found.   EKG Interpretation None      MDM   Final diagnoses:  Acute back pain   Patient with clinically muscular skilled back pain, no red flags. Patient had an ulcer in the past and does not tolerate NSAIDs well. Narcotics given for pain, Valium for muscle spasm and tension. Discussed supportive care and reasons to return.  Patient's care be signed out to ED provider to reassess pain and ambulation, likely plan for outpatient follow-up.   Medications  diazepam (VALIUM) tablet 5 mg (not administered)  HYDROmorphone (DILAUDID) injection 1 mg (not administered)    Filed Vitals:   01/11/14 0705  BP: 123/67  Pulse: 66  Temp: 98.4 F (36.9 C)  TempSrc: Oral  Resp: 20  Height: 5\' 2"  (1.575 m)  Weight: 220 lb (99.791 kg)   SpO2: 99%    Final diagnoses:  Acute back pain        Mariea Clonts, MD 01/11/14 0745

## 2014-01-11 NOTE — Discharge Instructions (Signed)
If you were given medicines take as directed.  If you are on coumadin or contraceptives realize their levels and effectiveness is altered by many different medicines.  If you have any reaction (rash, tongues swelling, other) to the medicines stop taking and see a physician.   Please follow up as directed and return to the ER or see a physician for new or worsening symptoms.  Thank you. Filed Vitals:   01/11/14 0705  BP: 123/67  Pulse: 66  Temp: 98.4 F (36.9 C)  TempSrc: Oral  Resp: 20  Height: 5\' 2"  (1.575 m)  Weight: 220 lb (99.791 kg)  SpO2: 99%

## 2014-01-11 NOTE — ED Notes (Signed)
Moving boxes yesterday and hurt her back, states she heard a "pop" as she was putting a box down. Now c/o back pain in her lower left back area.

## 2014-01-11 NOTE — ED Provider Notes (Signed)
Patient check out received from Dr. Reather Converse.  Patient feels improved. She has ambulated in the room with a normal gait. Discharge plan discussed with patient prior to Dr. Nuala Alpha departure. She voices understanding of return precautions and need for follow-up.  Shaune Pollack, MD 01/11/14 [phone]

## 2014-03-05 ENCOUNTER — Encounter: Payer: Self-pay | Admitting: Gynecology

## 2014-12-02 ENCOUNTER — Encounter: Payer: Self-pay | Admitting: Gynecology

## 2014-12-02 ENCOUNTER — Other Ambulatory Visit (HOSPITAL_COMMUNITY)
Admission: RE | Admit: 2014-12-02 | Discharge: 2014-12-02 | Disposition: A | Discharge status: Home / Self Care | Payer: BLUE CROSS/BLUE SHIELD | Source: Ambulatory Visit | Attending: Gynecology | Admitting: Gynecology

## 2014-12-02 ENCOUNTER — Ambulatory Visit (INDEPENDENT_AMBULATORY_CARE_PROVIDER_SITE_OTHER): Payer: BLUE CROSS/BLUE SHIELD | Admitting: Gynecology

## 2014-12-02 VITALS — BP 120/84 | Ht 62.0 in | Wt 239.0 lb

## 2014-12-02 DIAGNOSIS — Z01419 Encounter for gynecological examination (general) (routine) without abnormal findings: Secondary | ICD-10-CM | POA: Diagnosis not present

## 2014-12-02 DIAGNOSIS — Z79890 Hormone replacement therapy (postmenopausal): Secondary | ICD-10-CM

## 2014-12-02 DIAGNOSIS — N893 Dysplasia of vagina, unspecified: Secondary | ICD-10-CM | POA: Diagnosis not present

## 2014-12-02 MED ORDER — ESTRADIOL 0.075 MG/24HR TD PTTW: MEDICATED_PATCH | TRANSDERMAL | Status: DC

## 2014-12-02 NOTE — Progress Notes (Signed)
Mandy Bond [birth year] [id number]        49 y.o.  G0P0  No LMP recorded. Patient has had a hysterectomy. for annual exam.  Several issues noted below.  Past medical history,surgical history, problem list, medications, allergies, family history and social history were all reviewed and documented as reviewed in the EPIC chart.  ROS:  Performed with pertinent positives and negatives included in the history, assessment and plan.   Additional significant findings :  none   Exam: Leanne Lovely Vitals:   12/02/14 1518  BP: 120/84  Height: 5\' 2"  (1.575 m)  Weight: 239 lb (108.41 kg)   General appearance:  Normal affect, orientation and appearance. Skin: Grossly normal HEENT: Without gross lesions.  No cervical or supraclavicular adenopathy. Thyroid normal.  Lungs:  Clear without wheezing, rales or rhonchi Cardiac: RR, without RMG Abdominal:  Soft, nontender, without masses, guarding, rebound, organomegaly or hernia Breasts:  Examined lying and sitting without masses, retractions, discharge or axillary adenopathy. Pelvic:  Ext/BUS/vagina normal. Pap smear of vaginal cuff done  Adnexa  Without masses or tenderness    Anus and perineum  Normal   Rectovaginal  Normal sphincter tone without palpated masses or tenderness.    Assessment/Plan:  49 y.o. G0P0 female for annual exam.   1. HRT. Patient status post TAH/BSO for endometriosis. On Vivelle 0.075 mg patches. Doing well. I again reviewed the issues and risks to include the WHI study with the increased risk of stroke heart attack DVT and breast cancer. Patient notes though that her patches are expensive and I discussed possibly switching her to an oral dose reviewing a possible slight increased risk of thrombosis. Patient at this point with systemic patch but may call for switchover depending on what her insurance does. Vivelle 0.075 mg patches 1 year prescribed. 2. History of VAIN 1. Had vaginal cuff biopsies 2013 and 2014  with VAIN 1. Pap smear 2014 2015 normal. Pap smear of vaginal cuff done today. 3. Mammography 03/2014. Continue with annual mammography. SBE monthly reviewed. 4. Health maintenance. No routine blood work done as patient reports his done at her primary physician's office. Follow up 1 year, sooner as needed.   Anastasio Auerbach MD, 3:47 PM 12/02/2014

## 2014-12-02 NOTE — Patient Instructions (Signed)
Schedule your colonoscopy with either:  [facility]   Address: [address], [place], [place] [address]  [phone]    or  [facility]  Address: [address], [place], [place] [address]  [phone]      You may obtain a copy of any labs that were done today by logging onto MyChart as outlined in the instructions provided with your AVS (after visit summary). The office will not call with normal lab results but certainly if there are any significant abnormalities then we will contact you.   Health Maintenance Adopting a healthy lifestyle and getting preventive care can go a long way to promote health and wellness. Talk with your health care provider about what schedule of regular examinations is right for you. This is a good chance for you to check in with your provider about disease prevention and staying healthy. In between checkups, there are plenty of things you can do on your own. Experts have done a lot of research about which lifestyle changes and preventive measures are most likely to keep you healthy. Ask your health care provider for more information. WEIGHT AND DIET  Eat a healthy diet  Be sure to include plenty of vegetables, fruits, low-fat dairy products, and lean protein.  Do not eat a lot of foods high in solid fats, added sugars, or salt.  Get regular exercise. This is one of the most important things you can do for your health.  Most adults should exercise for at least 150 minutes each week. The exercise should increase your heart rate and make you sweat (moderate-intensity exercise).  Most adults should also do strengthening exercises at least twice a week. This is in addition to the moderate-intensity exercise.  Maintain a healthy weight  Body mass index (BMI) is a measurement that can be used to identify possible weight problems. It estimates body fat based on height and weight. Your health care provider can help determine  your BMI and help you achieve or maintain a healthy weight.  For females 22 years of age and older:   A BMI below 18.5 is considered underweight.  A BMI of 18.5 to 24.9 is normal.  A BMI of 25 to 29.9 is considered overweight.  A BMI of 30 and above is considered obese.  Watch levels of cholesterol and blood lipids  You should start having your blood tested for lipids and cholesterol at 51 years of age, then have this test every 5 years.  You may need to have your cholesterol levels checked more often if:  Your lipid or cholesterol levels are high.  You are older than 51 years of age.  You are at high risk for heart disease.  CANCER SCREENING   Lung Cancer  Lung cancer screening is recommended for adults 19-51 years old who are at high risk for lung cancer because of a history of smoking.  A yearly low-dose CT scan of the lungs is recommended for people who:  Currently smoke.  Have quit within the past 15 years.  Have at least a 30-pack-year history of smoking. A pack year is smoking an average of one pack of cigarettes a day for 1 year.  Yearly screening should continue until it has been 15 years since you quit.  Yearly screening should stop if you develop a health problem that would prevent you from having lung cancer treatment.  Breast Cancer  Practice breast self-awareness. This means understanding how your breasts normally appear and  feel.  It also means doing regular breast self-exams. Let your health care provider know about any changes, no matter how small.  If you are in your 20s or 30s, you should have a clinical breast exam (CBE) by a health care provider every 1-3 years as part of a regular health exam.  If you are 35 or older, have a CBE every year. Also consider having a breast X-ray (mammogram) every year.  If you have a family history of breast cancer, talk to your health care provider about genetic screening.  If you are at high risk for breast  cancer, talk to your health care provider about having an MRI and a mammogram every year.  Breast cancer gene (BRCA) assessment is recommended for women who have family members with BRCA-related cancers. BRCA-related cancers include:  Breast.  Ovarian.  Tubal.  Peritoneal cancers.  Results of the assessment will determine the need for genetic counseling and BRCA1 and BRCA2 testing. Cervical Cancer Routine pelvic examinations to screen for cervical cancer are no longer recommended for nonpregnant women who are considered low risk for cancer of the pelvic organs (ovaries, uterus, and vagina) and who do not have symptoms. A pelvic examination may be necessary if you have symptoms including those associated with pelvic infections. Ask your health care provider if a screening pelvic exam is right for you.   The Pap test is the screening test for cervical cancer for women who are considered at risk.  If you had a hysterectomy for a problem that was not cancer or a condition that could lead to cancer, then you no longer need Pap tests.  If you are older than 65 years, and you have had normal Pap tests for the past 10 years, you no longer need to have Pap tests.  If you have had past treatment for cervical cancer or a condition that could lead to cancer, you need Pap tests and screening for cancer for at least 20 years after your treatment.  If you no longer get a Pap test, assess your risk factors if they change (such as having a new sexual partner). This can affect whether you should start being screened again.  Some women have medical problems that increase their chance of getting cervical cancer. If this is the case for you, your health care provider may recommend more frequent screening and Pap tests.  The human papillomavirus (HPV) test is another test that may be used for cervical cancer screening. The HPV test looks for the virus that can cause cell changes in the cervix. The cells  collected during the Pap test can be tested for HPV.  The HPV test can be used to screen women 72 years of age and older. Getting tested for HPV can extend the interval between normal Pap tests from three to five years.  An HPV test also should be used to screen women of any age who have unclear Pap test results.  After 50 years of age, women should have HPV testing as often as Pap tests.  Colorectal Cancer  This type of cancer can be detected and often prevented.  Routine colorectal cancer screening usually begins at 51 years of age and continues through 51 years of age.  Your health care provider may recommend screening at an earlier age if you have risk factors for colon cancer.  Your health care provider may also recommend using home test kits to check for hidden blood in the stool.  A small camera  at the end of a tube can be used to examine your colon directly (sigmoidoscopy or colonoscopy). This is done to check for the earliest forms of colorectal cancer.  Routine screening usually begins at age 40.  Direct examination of the colon should be repeated every 5-10 years through 51 years of age. However, you may need to be screened more often if early forms of precancerous polyps or small growths are found. Skin Cancer  Check your skin from head to toe regularly.  Tell your health care provider about any new moles or changes in moles, especially if there is a change in a mole's shape or color.  Also tell your health care provider if you have a mole that is larger than the size of a pencil eraser.  Always use sunscreen. Apply sunscreen liberally and repeatedly throughout the day.  Protect yourself by wearing long sleeves, pants, a wide-brimmed hat, and sunglasses whenever you are outside. HEART DISEASE, DIABETES, AND HIGH BLOOD PRESSURE   Have your blood pressure checked at least every 1-2 years. High blood pressure causes heart disease and increases the risk of stroke.  If  you are between 74 years and 72 years old, ask your health care provider if you should take aspirin to prevent strokes.  Have regular diabetes screenings. This involves taking a blood sample to check your fasting blood sugar level.  If you are at a normal weight and have a low risk for diabetes, have this test once every three years after 51 years of age.  If you are overweight and have a high risk for diabetes, consider being tested at a younger age or more often. PREVENTING INFECTION  Hepatitis B  If you have a higher risk for hepatitis B, you should be screened for this virus. You are considered at high risk for hepatitis B if:  You were born in a country where hepatitis B is common. Ask your health care provider which countries are considered high risk.  Your parents were born in a high-risk country, and you have not been immunized against hepatitis B (hepatitis B vaccine).  You have HIV or AIDS.  You use needles to inject street drugs.  You live with someone who has hepatitis B.  You have had sex with someone who has hepatitis B.  You get hemodialysis treatment.  You take certain medicines for conditions, including cancer, organ transplantation, and autoimmune conditions. Hepatitis C  Blood testing is recommended for:  Everyone born from [birth year] through 1967.  Anyone with known risk factors for hepatitis C. Sexually transmitted infections (STIs)  You should be screened for sexually transmitted infections (STIs) including gonorrhea and chlamydia if:  You are sexually active and are younger than 51 years of age.  You are older than 51 years of age and your health care provider tells you that you are at risk for this type of infection.  Your sexual activity has changed since you were last screened and you are at an increased risk for chlamydia or gonorrhea. Ask your health care provider if you are at risk.  If you do not have HIV, but are at risk, it may be recommended that  you take a prescription medicine daily to prevent HIV infection. This is called pre-exposure prophylaxis (PrEP). You are considered at risk if:  You are sexually active and do not regularly use condoms or know the HIV status of your partner(s).  You take drugs by injection.  You are sexually active with a partner  who has HIV. Talk with your health care provider about whether you are at high risk of being infected with HIV. If you choose to begin PrEP, you should first be tested for HIV. You should then be tested every 3 months for as long as you are taking PrEP.  PREGNANCY   If you are premenopausal and you may become pregnant, ask your health care provider about preconception counseling.  If you may become pregnant, take 400 to 800 micrograms (mcg) of folic acid every day.  If you want to prevent pregnancy, talk to your health care provider about birth control (contraception). OSTEOPOROSIS AND MENOPAUSE   Osteoporosis is a disease in which the bones lose minerals and strength with aging. This can result in serious bone fractures. Your risk for osteoporosis can be identified using a bone density scan.  If you are 8 years of age or older, or if you are at risk for osteoporosis and fractures, ask your health care provider if you should be screened.  Ask your health care provider whether you should take a calcium or vitamin D supplement to lower your risk for osteoporosis.  Menopause may have certain physical symptoms and risks.  Hormone replacement therapy may reduce some of these symptoms and risks. Talk to your health care provider about whether hormone replacement therapy is right for you.  HOME CARE INSTRUCTIONS   Schedule regular health, dental, and eye exams.  Stay current with your immunizations.   Do not use any tobacco products including cigarettes, chewing tobacco, or electronic cigarettes.  If you are pregnant, do not drink alcohol.  If you are breastfeeding, limit how  much and how often you drink alcohol.  Limit alcohol intake to no more than 1 drink per day for nonpregnant women. One drink equals 12 ounces of beer, 5 ounces of wine, or 1 ounces of hard liquor.  Do not use street drugs.  Do not share needles.  Ask your health care provider for help if you need support or information about quitting drugs.  Tell your health care provider if you often feel depressed.  Tell your health care provider if you have ever been abused or do not feel safe at home. Document Released: 07/04/2010 Document Revised: 05/05/2013 Document Reviewed: 11/20/2012 [facility] Patient Information 2015 [place], [place]. This information is not intended to replace advice given to you by your health care provider. Make sure you discuss any questions you have with your health care provider.

## 2014-12-02 NOTE — Addendum Note (Signed)
Addended by: Burnett Kanaris on: 12/02/2014 03:53 PM   Modules accepted: Orders

## 2014-12-04 LAB — CYTOLOGY - PAP

## 2015-01-03 HISTORY — PX: COLONOSCOPY: SHX174

## 2015-01-08 ENCOUNTER — Emergency Department (HOSPITAL_BASED_OUTPATIENT_CLINIC_OR_DEPARTMENT_OTHER): Payer: BLUE CROSS/BLUE SHIELD

## 2015-01-08 ENCOUNTER — Encounter (HOSPITAL_BASED_OUTPATIENT_CLINIC_OR_DEPARTMENT_OTHER): Payer: Self-pay | Admitting: *Deleted

## 2015-01-08 ENCOUNTER — Emergency Department (HOSPITAL_BASED_OUTPATIENT_CLINIC_OR_DEPARTMENT_OTHER)
Admission: EM | Admit: 2015-01-08 | Discharge: 2015-01-08 | Disposition: A | Discharge status: Home / Self Care | Payer: BLUE CROSS/BLUE SHIELD | Attending: Emergency Medicine | Admitting: Emergency Medicine

## 2015-01-08 DIAGNOSIS — R079 Chest pain, unspecified: Secondary | ICD-10-CM | POA: Diagnosis present

## 2015-01-08 DIAGNOSIS — Z8742 Personal history of other diseases of the female genital tract: Secondary | ICD-10-CM | POA: Diagnosis not present

## 2015-01-08 DIAGNOSIS — M25511 Pain in right shoulder: Secondary | ICD-10-CM | POA: Insufficient documentation

## 2015-01-08 DIAGNOSIS — Z8639 Personal history of other endocrine, nutritional and metabolic disease: Secondary | ICD-10-CM | POA: Insufficient documentation

## 2015-01-08 DIAGNOSIS — R0789 Other chest pain: Secondary | ICD-10-CM | POA: Insufficient documentation

## 2015-01-08 LAB — CBC
HEMATOCRIT: 37.2 % (ref 36.0–46.0)
HEMOGLOBIN: 12.7 g/dL (ref 12.0–15.0)
MCH: 30.5 pg (ref 26.0–34.0)
MCHC: 34.1 g/dL (ref 30.0–36.0)
MCV: 89.2 fL (ref 78.0–100.0)
Platelets: 226 10*3/uL (ref 150–400)
RBC: 4.17 MIL/uL (ref 3.87–5.11)
RDW: 13.1 % (ref 11.5–15.5)
WBC: 6.8 10*3/uL (ref 4.0–10.5)

## 2015-01-08 LAB — COMPREHENSIVE METABOLIC PANEL
ALBUMIN: 4.2 g/dL (ref 3.5–5.0)
ALK PHOS: 79 U/L (ref 38–126)
ALT: 28 U/L (ref 14–54)
AST: 21 U/L (ref 15–41)
Anion gap: 8 (ref 5–15)
BILIRUBIN TOTAL: 1.2 mg/dL (ref 0.3–1.2)
BUN: 16 mg/dL (ref 6–20)
CALCIUM: 8.8 mg/dL — AB (ref 8.9–10.3)
CO2: 23 mmol/L (ref 22–32)
CREATININE: 0.94 mg/dL (ref 0.44–1.00)
Chloride: 107 mmol/L (ref 101–111)
GFR calc Af Amer: >60 mL/min (ref >60)
GLUCOSE: 124 mg/dL — AB (ref 65–99)
Potassium: 3.9 mmol/L (ref 3.5–5.1)
Sodium: 138 mmol/L (ref 135–145)
TOTAL PROTEIN: 8 g/dL (ref 6.5–8.1)

## 2015-01-08 LAB — PROTIME-INR
INR: 0.91 (ref 0.00–1.49)
Prothrombin Time: 12.5 seconds (ref 11.6–15.2)

## 2015-01-08 LAB — TROPONIN I
Troponin I: <0.03 ng/mL (ref <0.031)
Troponin I: <0.03 ng/mL (ref <0.031)

## 2015-01-08 MED ORDER — ASPIRIN 81 MG PO CHEW
324.0000 mg | CHEWABLE_TABLET | Freq: Once | ORAL | Status: AC
  Administered 2015-01-08: 324 mg via ORAL
  Filled 2015-01-08: qty 4

## 2015-01-08 MED ORDER — SODIUM CHLORIDE 0.9 % IV SOLN
1000.0000 mL | INTRAVENOUS | Status: DC
  Administered 2015-01-08: 1000 mL via INTRAVENOUS

## 2015-01-08 MED ORDER — CYCLOBENZAPRINE HCL 5 MG PO TABS: 5.0000 mg | ORAL_TABLET | Freq: Three times a day (TID) | ORAL | Status: DC | PRN

## 2015-01-08 MED ORDER — IOHEXOL 350 MG/ML SOLN
100.0000 mL | Freq: Once | INTRAVENOUS | Status: AC | PRN
  Administered 2015-01-08: 100 mL via INTRAVENOUS

## 2015-01-08 MED ORDER — NAPROXEN 500 MG PO TABS: 500.0000 mg | ORAL_TABLET | Freq: Two times a day (BID) | ORAL | Status: DC

## 2015-01-08 MED ORDER — MORPHINE SULFATE (PF) 4 MG/ML IV SOLN
4.0000 mg | INTRAVENOUS | Status: DC | PRN
  Administered 2015-01-08 (×2): 4 mg via INTRAVENOUS
  Filled 2015-01-08 (×2): qty 1

## 2015-01-08 MED FILL — NAPROXEN 500 MG TABLET: 500 | 15 days supply | Qty: 30 | Fill #0

## 2015-01-08 MED FILL — CYCLOBENZAPRINE 5 MG TABLET: 5 | 7 days supply | Qty: 21 | Fill #0

## 2015-01-08 NOTE — ED Provider Notes (Signed)
CSN: [id number]     Arrival date & time 01/08/15  0707 History   First MD Initiated Contact with Patient 01/08/15 0720     Chief Complaint  Patient presents with  . Chest Pain   HPI Pt started having pain in her right shoulder yesterday.  It persisred throughout the night.  The pain this morning moved toward the front of the chest.  The pain is a heaviness in the chest.  No nausea, dyspnea or diaphoresis.   The pain in the chest started about an hour ago.  No history of heart or lung disease .  No recent travel.  No pe.  Deep breathing increases the pain. Past Medical History  Diagnosis Date  . Endometriosis 12/18/07    STAGE 4--TAH, BSO  . Seasonal allergies   . Hypothyroid   . AGUS favor benign 2007    w/u suggested endometriosis of cervix  . 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 Cholecystectomy    . Neurectomy foot    . Pelvic laparoscopy      DIAG LAP/STAGE 4 ENDOMETRIOSIS  . Abdominal hysterectomy  12/2007    TAH,BSO  . Dilation and curettage of uterus    . Tonsillectomy    . Hysteroscopy  12/2000, 08/2005    AND D&C  . Oophorectomy      BSO  . Knee surgery     Family History  Problem Relation Age of Onset  . Breast cancer Mother 63  . Diabetes Mother   . Diabetes Sister   . Hashimoto's thyroiditis Sister   . Breast cancer Maternal Aunt     50's  . Cancer Father     Lung cancer   Social History  Substance Use Topics  . Smoking status: Never Smoker   . Smokeless tobacco: None  . Alcohol Use: 0.5 oz/week    1 drink(s) per week     Comment: occassional   OB History    Gravida Para Term Preterm AB TAB SAB Ectopic Multiple Living   0              Review of Systems  All other systems reviewed and are negative.     Allergies  Macrobid; Neomycin; and Vicodin  Home Medications   Prior to Admission medications   Medication  Sig Start Date End Date Taking? Authorizing Provider  estradiol (VIVELLE-DOT) 0.075 MG/24HR APPLY ONE PATCH ONTO THE SKIN TWICE WEEKLY 12/02/14  Yes Anastasio Auerbach, MD  Omeprazole (PRILOSEC PO) Take by mouth.     Yes Historical Provider, MD  phentermine 15 MG capsule Take 15 mg by mouth every morning.   Yes Historical Provider, MD  cyclobenzaprine (FLEXERIL) 5 MG tablet Take 1 tablet (5 mg total) by mouth 3 (three) times daily as needed for muscle spasms. 01/08/15   Dorie Rank, MD  naproxen (NAPROSYN) 500 MG tablet Take 1 tablet (500 mg total) by mouth 2 (two) times daily. 01/08/15   Dorie Rank, MD   BP 108/65 mmHg  Pulse 71  Temp(Src) 98.1 F (36.7 C) (Oral)  Resp 13  Ht 5\' 2"  (1.575 m)  Wt 104.327 kg  BMI 42.06 kg/m2  SpO2 99% Physical Exam  Constitutional: She appears well-developed and well-nourished. No distress.  HENT:  Head: Normocephalic and atraumatic.  Right Ear: External ear normal.  Left Ear: External ear normal.  Eyes: Conjunctivae are normal. Right eye exhibits no discharge. Left eye exhibits no discharge. No scleral icterus.  Neck: Neck supple. No tracheal deviation present.  Cardiovascular: Normal rate, regular rhythm and intact distal pulses.   Pulmonary/Chest: Effort normal and breath sounds normal. No stridor. No respiratory distress. She has no wheezes. She has no rales. She exhibits tenderness (anterior chest wall tenderness to palpation).  Abdominal: Soft. Bowel sounds are normal. She exhibits no distension. There is no tenderness. There is no rebound and no guarding.  Musculoskeletal: She exhibits no edema or tenderness.  No pain with range of motion of her upper extremities  Neurological: She is alert. She has normal strength. No cranial nerve deficit (no facial droop, extraocular movements intact, no slurred speech) or sensory deficit. She exhibits normal muscle tone. She displays no seizure activity. Coordination normal.  Skin: Skin is warm and dry. No rash  noted.  Psychiatric: She has a normal mood and affect.  Nursing note and vitals reviewed.   ED Course  Procedures (including critical care time) Labs Review Labs Reviewed  COMPREHENSIVE METABOLIC PANEL - Abnormal; Notable for the following:    Glucose, Bld 124 (*)    Calcium 8.8 (*)    All other components within normal limits  CBC  PROTIME-INR  TROPONIN I  TROPONIN I  TROPONIN I    Imaging Review Dg Chest Portable 1 View  01/08/2015  CLINICAL DATA:  Chest pain and right shoulder blade pain. EXAM: PORTABLE CHEST 1 VIEW COMPARISON:  None. FINDINGS: The heart size and mediastinal contours are within normal limits. Both lungs are clear. The visualized skeletal structures are unremarkable. IMPRESSION: Normal chest. Electronically Signed   By: Lorriane Shire M.D.   On: 01/08/2015 07:38   Ct Angio Chest Aorta W/cm &/or Wo/cm  01/08/2015  CLINICAL DATA:  Acute mid chest pain radiating to back in the region of the right scapula. EXAM: CT ANGIOGRAPHY CHEST WITH CONTRAST TECHNIQUE: Multidetector CT imaging of the chest was performed using the standard protocol during bolus administration of intravenous contrast. Multiplanar CT image reconstructions and MIPs were obtained to evaluate the vascular anatomy. CONTRAST:  157mL OMNIPAQUE IOHEXOL 350 MG/ML SOLN COMPARISON:  Chest x-ray earlier today. FINDINGS: The thoracic aorta is well opacified and shows normal caliber without evidence of aneurysmal disease. There is no evidence of aortic dissection. Proximal great vessels show normal branching anatomy and normal patency. The heart size is normal. The pulmonary arteries are also very well opacified on the study and pulmonary embolism is excluded. Central pulmonary arteries are normal in caliber. Lungs are normally aerated. There is no evidence of pulmonary edema, consolidation, pneumothorax, nodule or pleural fluid. No enlarged lymph nodes are seen. No evidence of pericardial fluid. Visualized airways are  normally patent. Visualized upper abdomen shows diffuse hepatic steatosis and a rounded area of low attenuation in the posterior aspect of segment 3 of the left lobe. This area measures approximately 5.5 x 6.9 cm and causes some posterior capsular bulging. Arterial phase begins to show potentially some early arterial phase enhancement around the periphery of this area and this may represent a large hemangioma. Due to the size of this abnormality, further elective evaluation with MRI of the abdomen with and without gadolinium is recommended. Review of the MIP images confirms the above findings. IMPRESSION: 1. Normal thoracic aorta without evidence of acute aortic dissection. 2. Normal pulmonary arteries without evidence of pulmonary embolism. 3. No acute findings in the chest. 4. Diffuse steatosis of the liver  with separate oval-shaped low-attenuation area in the posterior aspect of segment 3 of the left lobe measuring 5.5 x 6.9 cm transversely. On the arterial phase, there is suggestion of potential early peripheral enhancement. This is felt statistically and by imaging to most likely represent a large hemangioma. Given the size of the abnormality, further evaluation is warranted with elective MRI of the abdomen with and without gadolinium. Electronically Signed   By: Aletta Edouard M.D.   On: 01/08/2015 09:00   I have personally reviewed and evaluated these images and lab results as part of my medical decision-making.   EKG Interpretation   Date/Time:  Friday January 08 2015 07:15:05 EST Ventricular Rate:  90 PR Interval:  132 QRS Duration: 96 QT Interval:  376 QTC Calculation: 459 R Axis:   65 Text Interpretation:  Normal sinus rhythm Incomplete right bundle branch  block Borderline ECG No significant change since last tracing Confirmed by  Jabori Henegar  MD-J, Daemyn Gariepy ([id number]) on 01/08/2015 7:19:08 AM      MDM   Final diagnoses:  Chest pain, unspecified chest pain type   Patient's symptoms were  concerning for the possibility of aortic injury considering her pain in her back and chest. CT angios was performed that does not show any evidence of any aortic dissection. There is no pulmonary embolism or any other acute abnormality.  The patient's EKG does not show any evidence of an acute ischemic changes. She's had 2 sets of cardiac enzymes that are normal. Symptoms started last evening, therefore I do not think this is related to acute cardiac ischemia.  Plan on discharge home with medications for pain and muscle relaxant. Follow-up with primary care doctor. Warning signs and precautions discussed.     Dorie Rank, MD 01/08/15 [phone]

## 2015-01-08 NOTE — ED Notes (Signed)
C/o right shoulder blade pain yesterday and moved around to mid chest. No other sx. No injury.

## 2015-01-08 NOTE — Discharge Instructions (Signed)

## 2015-01-08 NOTE — ED Notes (Signed)
Pt c/o back pain between shoulder blades 8/10 and request something for pain. Dr. Tomi Bamberger aware.

## 2015-01-20 ENCOUNTER — Other Ambulatory Visit: Payer: Self-pay | Admitting: Internal Medicine

## 2015-01-20 DIAGNOSIS — R160 Hepatomegaly, not elsewhere classified: Secondary | ICD-10-CM

## 2015-01-22 ENCOUNTER — Ambulatory Visit
Admission: RE | Admit: 2015-01-22 | Discharge: 2015-01-22 | Disposition: A | Discharge status: Home / Self Care | Payer: BLUE CROSS/BLUE SHIELD | Source: Ambulatory Visit | Attending: Internal Medicine | Admitting: Internal Medicine

## 2015-01-22 DIAGNOSIS — R160 Hepatomegaly, not elsewhere classified: Secondary | ICD-10-CM

## 2015-01-22 MED ORDER — GADOBENATE DIMEGLUMINE 529 MG/ML IV SOLN
20.0000 mL | Freq: Once | INTRAVENOUS | Status: AC | PRN
  Administered 2015-01-22: 20 mL via INTRAVENOUS

## 2015-02-05 ENCOUNTER — Encounter: Payer: Self-pay | Admitting: Internal Medicine

## 2015-02-11 ENCOUNTER — Other Ambulatory Visit: Payer: Self-pay

## 2015-02-11 MED ORDER — ESTRADIOL 0.075 MG/24HR TD PTTW: MEDICATED_PATCH | TRANSDERMAL | Status: DC

## 2015-07-23 DIAGNOSIS — M25562 Pain in left knee: Secondary | ICD-10-CM | POA: Diagnosis not present

## 2015-07-23 DIAGNOSIS — Z6841 Body Mass Index (BMI) 40.0 and over, adult: Secondary | ICD-10-CM | POA: Diagnosis not present

## 2015-07-23 DIAGNOSIS — M25561 Pain in right knee: Secondary | ICD-10-CM | POA: Diagnosis not present

## 2015-07-23 DIAGNOSIS — M170 Bilateral primary osteoarthritis of knee: Secondary | ICD-10-CM | POA: Diagnosis not present

## 2015-10-06 DIAGNOSIS — R5383 Other fatigue: Secondary | ICD-10-CM | POA: Diagnosis not present

## 2015-10-06 DIAGNOSIS — K1120 Sialoadenitis, unspecified: Secondary | ICD-10-CM | POA: Diagnosis not present

## 2015-10-06 DIAGNOSIS — R599 Enlarged lymph nodes, unspecified: Secondary | ICD-10-CM | POA: Diagnosis not present

## 2015-10-06 DIAGNOSIS — Z23 Encounter for immunization: Secondary | ICD-10-CM | POA: Diagnosis not present

## 2015-10-11 ENCOUNTER — Encounter: Payer: Self-pay | Admitting: Gastroenterology

## 2015-11-10 ENCOUNTER — Ambulatory Visit (AMBULATORY_SURGERY_CENTER): Payer: Self-pay | Admitting: *Deleted

## 2015-11-10 VITALS — Ht 62.0 in | Wt 236.0 lb

## 2015-11-10 DIAGNOSIS — Z1211 Encounter for screening for malignant neoplasm of colon: Secondary | ICD-10-CM

## 2015-11-10 MED ORDER — NA SULFATE-K SULFATE-MG SULF 17.5-3.13-1.6 GM/177ML PO SOLN: 1.0000 | Freq: Once | ORAL | 0 refills | Status: AC

## 2015-11-10 NOTE — Progress Notes (Signed)
No egg or soy allergy known to patient  No issues with past sedation with any surgeries  or procedures, no intubation problems  No diet pills per patient- off phentermine since spring 2017 No home 02 use per patient  No blood thinners per patient  Pt denies issues with constipation  No A fib or A flutter    emmi video to e mail

## 2015-11-23 DIAGNOSIS — Z1231 Encounter for screening mammogram for malignant neoplasm of breast: Secondary | ICD-10-CM | POA: Diagnosis not present

## 2015-11-23 DIAGNOSIS — Z803 Family history of malignant neoplasm of breast: Secondary | ICD-10-CM | POA: Diagnosis not present

## 2015-11-24 ENCOUNTER — Ambulatory Visit (AMBULATORY_SURGERY_CENTER): Payer: BLUE CROSS/BLUE SHIELD | Admitting: Gastroenterology

## 2015-11-24 ENCOUNTER — Encounter: Payer: Self-pay | Admitting: Gastroenterology

## 2015-11-24 VITALS — BP 100/53 | HR 79 | Temp 98.6°F | Resp 18 | Ht 62.0 in | Wt 236.0 lb

## 2015-11-24 DIAGNOSIS — K635 Polyp of colon: Secondary | ICD-10-CM | POA: Diagnosis not present

## 2015-11-24 DIAGNOSIS — Z1212 Encounter for screening for malignant neoplasm of rectum: Secondary | ICD-10-CM

## 2015-11-24 DIAGNOSIS — D125 Benign neoplasm of sigmoid colon: Secondary | ICD-10-CM

## 2015-11-24 DIAGNOSIS — D123 Benign neoplasm of transverse colon: Secondary | ICD-10-CM

## 2015-11-24 DIAGNOSIS — Z1211 Encounter for screening for malignant neoplasm of colon: Secondary | ICD-10-CM

## 2015-11-24 DIAGNOSIS — D124 Benign neoplasm of descending colon: Secondary | ICD-10-CM

## 2015-11-24 MED ORDER — SODIUM CHLORIDE 0.9 % IV SOLN: 500.0000 mL | INTRAVENOUS | Status: DC

## 2015-11-24 NOTE — Op Note (Signed)
[place] Patient Name: Mandy Bond Procedure Date: 11/24/2015 8:29 AM MRN: [id number] Endoscopist: Mauri Pole , MD Age: 50 Referring MD:  Date of Birth: 24-Sep-1965 Gender: Female Account #: [id number] Procedure:                Colonoscopy Indications:              Screening for colorectal malignant neoplasm, This                            is the patient's first colonoscopy Medicines:                Monitored Anesthesia Care Procedure:                Pre-Anesthesia Assessment:                           - Prior to the procedure, a History and Physical                            was performed, and patient medications and                            allergies were reviewed. The patient's tolerance of                            previous anesthesia was also reviewed. The risks                            and benefits of the procedure and the sedation                            options and risks were discussed with the patient.                            All questions were answered, and informed consent                            was obtained. Prior Anticoagulants: The patient has                            taken no previous anticoagulant or antiplatelet                            agents. ASA Grade Assessment: II - A patient with                            mild systemic disease. After reviewing the risks                            and benefits, the patient was deemed in                            satisfactory condition to undergo the procedure.  After obtaining informed consent, the colonoscope                            was passed under direct vision. Throughout the                            procedure, the patient's blood pressure, pulse, and                            oxygen saturations were monitored continuously. The                            Model CF-HQ190L [id number]) scope was introduced                            through the anus  and advanced to the the terminal                            ileum, with identification of the appendiceal                            orifice and IC valve. The colonoscopy was performed                            without difficulty. The patient tolerated the                            procedure well. The quality of the bowel                            preparation was good. The terminal ileum, ileocecal                            valve, appendiceal orifice, and rectum were                            photographed. Scope In: 8:38:42 AM Scope Out: 8:57:45 AM Scope Withdrawal Time: 0 hours 14 minutes 30 seconds  Total Procedure Duration: 0 hours 19 minutes 3 seconds  Findings:                 The perianal and digital rectal examinations were                            normal.                           Two sessile polyps were found in the descending                            colon. The polyps were 5 mm in size. These polyps                            were removed with a cold snare. Resection and  retrieval were complete. These polyps were removed                            with a cold snare. Resection and retrieval were                            complete.                           A 2 mm polyp was found in the sigmoid colon. The                            polyp was sessile. The polyp was removed with a                            cold biopsy forceps. Resection and retrieval were                            complete.                           Non-bleeding internal hemorrhoids were found during                            retroflexion. The hemorrhoids were small. Complications:            No immediate complications. Estimated Blood Loss:     Estimated blood loss: none. Impression:               - Two 5 mm polyps in the descending colon, removed                            with a cold snare. Resected and retrieved.                           - One 2 mm polyp in the sigmoid  colon, removed with                            a cold biopsy forceps. Resected and retrieved.                           - Non-bleeding internal hemorrhoids. Recommendation:           - Patient has a contact number available for                            emergencies. The signs and symptoms of potential                            delayed complications were discussed with the                            patient. Return to normal activities tomorrow.  Written discharge instructions were provided to the                            patient.                           - Resume previous diet.                           - Continue present medications.                           - Await pathology results.                           - Repeat colonoscopy in 3 - 5 years for                            surveillance based on pathology results.                           - Return to GI clinic PRN. Mauri Pole, MD 11/24/2015 9:08:05 AM This report has been signed electronically.

## 2015-11-24 NOTE — Progress Notes (Signed)
To recovery, report to Hod[place], RN, VSS 

## 2015-11-24 NOTE — Progress Notes (Signed)
Called to room to assist during endoscopic procedure.  Patient ID and intended procedure confirmed with present staff. Received instructions for my participation in the procedure from the performing physician.  

## 2015-11-24 NOTE — Patient Instructions (Signed)
YOU HAD AN ENDOSCOPIC PROCEDURE TODAY AT [facility]:   Refer to the procedure report that was given to you for any specific questions about what was found during the examination.  If the procedure report does not answer your questions, please call your gastroenterologist to clarify.  If you requested that your care partner not be given the details of your procedure findings, then the procedure report has been included in a sealed envelope for you to review at your convenience later.  YOU SHOULD EXPECT: Some feelings of bloating in the abdomen. Passage of more gas than usual.  Walking can help get rid of the air that was put into your GI tract during the procedure and reduce the bloating. If you had a lower endoscopy (such as a colonoscopy or flexible sigmoidoscopy) you may notice spotting of blood in your stool or on the toilet paper. If you underwent a bowel prep for your procedure, you may not have a normal bowel movement for a few days.  Please Note:  You might notice some irritation and congestion in your nose or some drainage.  This is from the oxygen used during your procedure.  There is no need for concern and it should clear up in a day or so.  SYMPTOMS TO REPORT IMMEDIATELY:   Following lower endoscopy (colonoscopy or flexible sigmoidoscopy):  Excessive amounts of blood in the stool  Significant tenderness or worsening of abdominal pains  Swelling of the abdomen that is new, acute  Fever of 100F or higher   For urgent or emergent issues, a gastroenterologist can be reached at any hour by calling [phone].   DIET:  We do recommend a small meal at first, but then you may proceed to your regular diet.  Drink plenty of fluids but you should avoid alcoholic beverages for 24 hours.  ACTIVITY:  You should plan to take it easy for the rest of today and you should NOT DRIVE or use heavy machinery until tomorrow (because of the sedation medicines used during the test).     FOLLOW UP: Our staff will call the number listed on your records the next business day following your procedure to check on you and address any questions or concerns that you may have regarding the information given to you following your procedure. If we do not reach you, we will leave a message.  However, if you are feeling well and you are not experiencing any problems, there is no need to return our call.  We will assume that you have returned to your regular daily activities without incident.  If any biopsies were taken you will be contacted by phone or by letter within the next 1-3 weeks.  Please call us at [phone] if you have not heard about the biopsies in 3 weeks.    SIGNATURES/CONFIDENTIALITY: You and/or your care partner have signed paperwork which will be entered into your electronic medical record.  These signatures attest to the fact that that the information above on your After Visit Summary has been reviewed and is understood.  Full responsibility of the confidentiality of this discharge information lies with you and/or your care-partner.  You will need another colonoscopy in 3-5 years.  Thank-you for choosing [place] for your healthcare needs today.

## 2015-11-29 ENCOUNTER — Telehealth: Payer: Self-pay | Admitting: *Deleted

## 2015-11-29 NOTE — Telephone Encounter (Signed)
  Follow up Call-  Call back number 11/24/2015  Post procedure Call Back phone  # [phone]  Permission to leave phone message Yes  Some recent data might be hidden     Patient questions:  Do you have a fever, pain , or abdominal swelling? No. Pain Score  0 *  Have you tolerated food without any problems? Yes.    Have you been able to return to your normal activities? Yes.    Do you have any questions about your discharge instructions: Diet   No. Medications  No. Follow up visit  No.  Do you have questions or concerns about your Care? No.  Actions: * If pain score is 4 or above: No action needed, pain <4.

## 2015-12-02 ENCOUNTER — Encounter: Payer: Self-pay | Admitting: Gynecology

## 2015-12-03 ENCOUNTER — Ambulatory Visit (INDEPENDENT_AMBULATORY_CARE_PROVIDER_SITE_OTHER): Payer: BLUE CROSS/BLUE SHIELD | Admitting: Gynecology

## 2015-12-03 ENCOUNTER — Encounter: Payer: Self-pay | Admitting: Gynecology

## 2015-12-03 VITALS — BP 122/78 | Ht 62.0 in | Wt 233.0 lb

## 2015-12-03 DIAGNOSIS — Z01419 Encounter for gynecological examination (general) (routine) without abnormal findings: Secondary | ICD-10-CM | POA: Diagnosis not present

## 2015-12-03 DIAGNOSIS — Z79890 Hormone replacement therapy (postmenopausal): Secondary | ICD-10-CM

## 2015-12-03 MED ORDER — ESTRADIOL 0.075 MG/24HR TD PTTW: MEDICATED_PATCH | TRANSDERMAL | 4 refills | Status: DC

## 2015-12-03 NOTE — Patient Instructions (Signed)

## 2015-12-03 NOTE — Progress Notes (Signed)
    Mandy Bond 02/07/1965 [id number]        50 y.o.  G0P0  for annual exam.    Past medical history,surgical history, problem list, medications, allergies, family history and social history were all reviewed and documented as reviewed in the EPIC chart.  ROS:  Performed with pertinent positives and negatives included in the history, assessment and plan.   Additional significant findings :  None   Exam: Caryn Bee assistant Vitals:   12/03/15 1455  BP: 122/78  Weight: 233 lb (105.7 kg)  Height: 5\' 2"  (1.575 m)   Body mass index is 42.62 kg/m.  General appearance:  Normal affect, orientation and appearance. Skin: Grossly normal HEENT: Without gross lesions.  No cervical or supraclavicular adenopathy. Thyroid normal.  Lungs:  Clear without wheezing, rales or rhonchi Cardiac: RR, without RMG Abdominal:  Soft, nontender, without masses, guarding, rebound, organomegaly or hernia Breasts:  Examined lying and sitting without masses, retractions, discharge or axillary adenopathy. Pelvic:  Ext, BUS, Vagina with atrophic changes  Adnexa without masses or tenderness    Anus and perineum normal   Rectovaginal normal sphincter tone without palpated masses or tenderness.    Assessment/Plan:  50 y.o. G0P0 female for annual exam.   1. Postmenopausal/HRT. Patient status post TVH/BSO for endometriosis. On Vivelle 0.075 mg patches. Doing well and wants to continue. I reviewed the latest 2017 NAMS guidelines on HRT. I discussed the benefits of symptom relief and possible cardiovascular/bone health benefits when started early versus risks of stroke heart attack DVT and possible breast cancer. Patient is comfortable continuing and I refilled her 1 year. 2. History of VAIN 1 on cuff biopsies 2013 and 2014. Pap smears 2014, 2015 and 2016 normal. No Pap smear done today. Will plan a less frequent screening interval per current screening guidelines. 3. Mammography 11/2015. Continue with annual  mammography when due. SBE monthly reviewed. 4. Health maintenance. No routine lab work done as patient reports this done elsewhere. Follow up 1 year, sooner as needed.    Anastasio Auerbach MD, 3:10 PM 12/03/2015

## 2015-12-06 ENCOUNTER — Encounter: Payer: Self-pay | Admitting: Gastroenterology

## 2015-12-13 DIAGNOSIS — Z0000 Encounter for general adult medical examination without abnormal findings: Secondary | ICD-10-CM | POA: Diagnosis not present

## 2015-12-13 DIAGNOSIS — R7309 Other abnormal glucose: Secondary | ICD-10-CM | POA: Diagnosis not present

## 2015-12-20 DIAGNOSIS — E668 Other obesity: Secondary | ICD-10-CM | POA: Diagnosis not present

## 2015-12-20 DIAGNOSIS — Z0000 Encounter for general adult medical examination without abnormal findings: Secondary | ICD-10-CM | POA: Diagnosis not present

## 2015-12-20 DIAGNOSIS — R946 Abnormal results of thyroid function studies: Secondary | ICD-10-CM | POA: Diagnosis not present

## 2015-12-20 DIAGNOSIS — Z1389 Encounter for screening for other disorder: Secondary | ICD-10-CM | POA: Diagnosis not present

## 2016-01-14 DIAGNOSIS — M25562 Pain in left knee: Secondary | ICD-10-CM | POA: Diagnosis not present

## 2016-01-14 DIAGNOSIS — M25561 Pain in right knee: Secondary | ICD-10-CM | POA: Diagnosis not present

## 2016-01-14 DIAGNOSIS — M1712 Unilateral primary osteoarthritis, left knee: Secondary | ICD-10-CM | POA: Diagnosis not present

## 2016-01-14 DIAGNOSIS — M1711 Unilateral primary osteoarthritis, right knee: Secondary | ICD-10-CM | POA: Diagnosis not present

## 2016-02-01 DIAGNOSIS — M1712 Unilateral primary osteoarthritis, left knee: Secondary | ICD-10-CM | POA: Diagnosis not present

## 2016-02-01 DIAGNOSIS — Z01812 Encounter for preprocedural laboratory examination: Secondary | ICD-10-CM | POA: Diagnosis not present

## 2016-02-01 DIAGNOSIS — Z01810 Encounter for preprocedural cardiovascular examination: Secondary | ICD-10-CM | POA: Diagnosis not present

## 2016-02-01 DIAGNOSIS — Z79899 Other long term (current) drug therapy: Secondary | ICD-10-CM | POA: Diagnosis not present

## 2016-02-01 DIAGNOSIS — Z6841 Body Mass Index (BMI) 40.0 and over, adult: Secondary | ICD-10-CM | POA: Diagnosis not present

## 2016-02-01 DIAGNOSIS — Z01818 Encounter for other preprocedural examination: Secondary | ICD-10-CM | POA: Diagnosis not present

## 2016-02-09 DIAGNOSIS — Z6841 Body Mass Index (BMI) 40.0 and over, adult: Secondary | ICD-10-CM | POA: Diagnosis not present

## 2016-02-09 DIAGNOSIS — Z96652 Presence of left artificial knee joint: Secondary | ICD-10-CM | POA: Diagnosis not present

## 2016-02-09 DIAGNOSIS — G4730 Sleep apnea, unspecified: Secondary | ICD-10-CM | POA: Diagnosis not present

## 2016-02-09 DIAGNOSIS — K219 Gastro-esophageal reflux disease without esophagitis: Secondary | ICD-10-CM | POA: Diagnosis not present

## 2016-02-09 DIAGNOSIS — Z881 Allergy status to other antibiotic agents status: Secondary | ICD-10-CM | POA: Diagnosis not present

## 2016-02-09 DIAGNOSIS — Z79899 Other long term (current) drug therapy: Secondary | ICD-10-CM | POA: Diagnosis not present

## 2016-02-09 DIAGNOSIS — Z471 Aftercare following joint replacement surgery: Secondary | ICD-10-CM | POA: Diagnosis not present

## 2016-02-09 DIAGNOSIS — M1712 Unilateral primary osteoarthritis, left knee: Secondary | ICD-10-CM | POA: Diagnosis not present

## 2016-02-09 DIAGNOSIS — Z885 Allergy status to narcotic agent status: Secondary | ICD-10-CM | POA: Diagnosis not present

## 2016-02-09 DIAGNOSIS — G8918 Other acute postprocedural pain: Secondary | ICD-10-CM | POA: Diagnosis not present

## 2016-02-11 DIAGNOSIS — M1712 Unilateral primary osteoarthritis, left knee: Secondary | ICD-10-CM | POA: Diagnosis not present

## 2016-02-12 DIAGNOSIS — K219 Gastro-esophageal reflux disease without esophagitis: Secondary | ICD-10-CM | POA: Diagnosis not present

## 2016-02-12 DIAGNOSIS — Z471 Aftercare following joint replacement surgery: Secondary | ICD-10-CM | POA: Diagnosis not present

## 2016-02-12 DIAGNOSIS — Z96652 Presence of left artificial knee joint: Secondary | ICD-10-CM | POA: Diagnosis not present

## 2016-02-12 DIAGNOSIS — M1711 Unilateral primary osteoarthritis, right knee: Secondary | ICD-10-CM | POA: Diagnosis not present

## 2016-02-14 DIAGNOSIS — Z96652 Presence of left artificial knee joint: Secondary | ICD-10-CM | POA: Diagnosis not present

## 2016-02-14 DIAGNOSIS — M1711 Unilateral primary osteoarthritis, right knee: Secondary | ICD-10-CM | POA: Diagnosis not present

## 2016-02-14 DIAGNOSIS — K219 Gastro-esophageal reflux disease without esophagitis: Secondary | ICD-10-CM | POA: Diagnosis not present

## 2016-02-14 DIAGNOSIS — Z471 Aftercare following joint replacement surgery: Secondary | ICD-10-CM | POA: Diagnosis not present

## 2016-02-16 DIAGNOSIS — Z96652 Presence of left artificial knee joint: Secondary | ICD-10-CM | POA: Diagnosis not present

## 2016-02-16 DIAGNOSIS — K219 Gastro-esophageal reflux disease without esophagitis: Secondary | ICD-10-CM | POA: Diagnosis not present

## 2016-02-16 DIAGNOSIS — Z471 Aftercare following joint replacement surgery: Secondary | ICD-10-CM | POA: Diagnosis not present

## 2016-02-16 DIAGNOSIS — M1711 Unilateral primary osteoarthritis, right knee: Secondary | ICD-10-CM | POA: Diagnosis not present

## 2016-02-18 DIAGNOSIS — K219 Gastro-esophageal reflux disease without esophagitis: Secondary | ICD-10-CM | POA: Diagnosis not present

## 2016-02-18 DIAGNOSIS — Z96652 Presence of left artificial knee joint: Secondary | ICD-10-CM | POA: Diagnosis not present

## 2016-02-18 DIAGNOSIS — Z471 Aftercare following joint replacement surgery: Secondary | ICD-10-CM | POA: Diagnosis not present

## 2016-02-18 DIAGNOSIS — M1711 Unilateral primary osteoarthritis, right knee: Secondary | ICD-10-CM | POA: Diagnosis not present

## 2016-02-20 DIAGNOSIS — M1711 Unilateral primary osteoarthritis, right knee: Secondary | ICD-10-CM | POA: Diagnosis not present

## 2016-02-20 DIAGNOSIS — Z471 Aftercare following joint replacement surgery: Secondary | ICD-10-CM | POA: Diagnosis not present

## 2016-02-20 DIAGNOSIS — Z96652 Presence of left artificial knee joint: Secondary | ICD-10-CM | POA: Diagnosis not present

## 2016-02-20 DIAGNOSIS — K219 Gastro-esophageal reflux disease without esophagitis: Secondary | ICD-10-CM | POA: Diagnosis not present

## 2016-02-23 DIAGNOSIS — M1711 Unilateral primary osteoarthritis, right knee: Secondary | ICD-10-CM | POA: Diagnosis not present

## 2016-02-23 DIAGNOSIS — K219 Gastro-esophageal reflux disease without esophagitis: Secondary | ICD-10-CM | POA: Diagnosis not present

## 2016-02-23 DIAGNOSIS — Z96652 Presence of left artificial knee joint: Secondary | ICD-10-CM | POA: Diagnosis not present

## 2016-02-23 DIAGNOSIS — Z471 Aftercare following joint replacement surgery: Secondary | ICD-10-CM | POA: Diagnosis not present

## 2016-02-25 DIAGNOSIS — M1711 Unilateral primary osteoarthritis, right knee: Secondary | ICD-10-CM | POA: Diagnosis not present

## 2016-02-25 DIAGNOSIS — K219 Gastro-esophageal reflux disease without esophagitis: Secondary | ICD-10-CM | POA: Diagnosis not present

## 2016-02-25 DIAGNOSIS — Z96652 Presence of left artificial knee joint: Secondary | ICD-10-CM | POA: Diagnosis not present

## 2016-02-25 DIAGNOSIS — Z471 Aftercare following joint replacement surgery: Secondary | ICD-10-CM | POA: Diagnosis not present

## 2016-03-01 ENCOUNTER — Ambulatory Visit
Payer: BLUE CROSS/BLUE SHIELD | Attending: Orthopaedic Surgery | Admitting: Rehabilitative and Restorative Service Providers"

## 2016-03-01 ENCOUNTER — Encounter: Payer: Self-pay | Admitting: Rehabilitative and Restorative Service Providers"

## 2016-03-01 DIAGNOSIS — R531 Weakness: Secondary | ICD-10-CM | POA: Insufficient documentation

## 2016-03-01 DIAGNOSIS — G8929 Other chronic pain: Secondary | ICD-10-CM | POA: Diagnosis not present

## 2016-03-01 DIAGNOSIS — R2689 Other abnormalities of gait and mobility: Secondary | ICD-10-CM | POA: Insufficient documentation

## 2016-03-01 DIAGNOSIS — M25562 Pain in left knee: Secondary | ICD-10-CM | POA: Diagnosis not present

## 2016-03-01 NOTE — Therapy (Signed)
[facility] [address]  [place], [place], [address] Phone: [phone]   Fax:  [phone]  Physical Therapy Evaluation  Patient Details  Name: Mandy Bond MRN: [id number] Date of Birth: Aug 03, 1965 Referring Provider: Dr Nickola Major   Encounter Date: 03/01/2016      PT End of Session - 03/01/16 1627    Visit Number 1   Number of Visits 12   Date for PT Re-Evaluation 04/12/16   PT Start Time 1430   PT Stop Time 1528   PT Time Calculation (min) 58 min   Activity Tolerance Patient tolerated treatment well      Past Medical History:  Diagnosis Date  . AGUS favor benign 2007   w/u suggested endometriosis of cervix  . Allergy   . Arthritis    knees  . 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 GERD (gastroesophageal reflux disease)   . Hypothyroid    pt denies- no medication for this   . Seasonal allergies   . Ulcer ([place]) 2005-2006   past hx long ago per pt.  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 ABDOMINAL HYSTERECTOMY  12/2007   TAH,BSO  . ANKLE SURGERY    . CHOLECYSTECTOMY    . DILATION AND CURETTAGE OF UTERUS    . HYSTEROSCOPY  12/2000, 08/2005   AND D&C  . KNEE SURGERY Bilateral   . NEURECTOMY FOOT    . OOPHORECTOMY     BSO  . PELVIC LAPAROSCOPY     DIAG LAP/STAGE 4 ENDOMETRIOSIS  . TONSILLECTOMY      There were no vitals filed for this visit.       Subjective Assessment - 03/01/16 1438    Subjective History of Lt knee pain progressively worsening over the years. Significant increase in pain over the past 6-12 months. 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 5-10 min    How long can you stand comfortably? 5-10 min    How long can you walk  comfortably? 5-10 min   Diagnostic tests xrays    Patient Stated Goals walk normally; exercise;  get back into the gym; get rid of pain    Currently in Pain? Yes   Pain Score 5    Pain Location Knee   Pain Orientation Left   Pain Descriptors / Indicators Sharp;Throbbing   Pain Type Surgical pain   Pain Radiating Towards into the thigh and shin    Pain Onset 1 to 4 weeks ago   Pain Frequency Constant   Aggravating Factors  overdoing it; on her feet more she should be; exercises    Pain Relieving Factors ice; pain meds at night; OTC meds;  elevation             [facility] PT Assessment - 03/01/16 0001      Assessment   Medical Diagnosis Lt TKA   Referring Provider Dr Nickola Major    Onset Date/Surgical Date 02/09/16   Hand Dominance Right   Next MD Visit 03/24/16   Prior Therapy home health ~2 weeks      Precautions   Precautions None     Balance Screen   Has the patient fallen in  the past 6 months No   Has the patient had a decrease in activity level because of a fear of falling?  No   Is the patient reluctant to leave their home because of a fear of falling?  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    Leisure household chores; gym 2-3 times/weeks - treatmill eliptical weight      Observation/Other Assessments   Skin Integrity well healed incision staples out 5 days ago steristrips in tact    Focus on Therapeutic Outcomes (FOTO)  54% limitation      Observation/Other Assessments-Edema    Edema --  moderate edema Lt knee      Sensation   Additional Comments WFL's      Posture/Postural Control   Posture Comments stands with wt shifted to Rt      AROM   Right/Left Hip --  WF's bilat    Right Knee Extension 8   Right Knee Flexion 134   Left Knee Extension -2   Left Knee  Flexion 90   Right/Left Ankle --  WFL's bilat      Strength   Right Hip Flexion 5/5   Right Hip Extension 5/5   Right Hip ABduction 5/5   Right Hip ADduction 5/5   Left Hip Flexion 4+/5   Left Hip Extension 4/5   Left Hip ABduction 4/5   Left Hip ADduction 4+/5   Right Knee Flexion 5/5   Right Knee Extension 5/5   Left Knee Flexion 4+/5   Left Knee Extension 4/5   Right/Left Ankle --  WNL's bilat      Flexibility   Hamstrings tight Lt ~ 70 deg; Rt 75 deg    Quadriceps prone knee flexion Lt 81 deg; Rt 112 deg    ITB tight Lt > Rt      Palpation   Patella mobility decreased    Palpation comment muscular tightness noted Lt quads/hamstrings/calf      Ambulation/Gait   Ambulation/Gait --  antalgic gait    Ambulation Distance (Feet) 100 Feet   Assistive device Straight cane   Gait Pattern Decreased stance time - left;Decreased hip/knee flexion - left;Decreased weight shift to left;Left hip hike   Ambulation Surface Level;Indoor     Functional Gait  Assessment   Gait assessed  --  further assessment of cane height indicated                    [facility] Adult PT Treatment/Exercise - 03/01/16 0001      Self-Care   Self-Care --  TENs info for pain management; ice; elevation      Therapeutic Activites    Therapeutic Activities --  instructed in myofacial release work - ball and stick     Neuro Re-ed    Neuro Re-ed Details  wt shifts      Knee/Hip Exercises: Stretches   Passive Hamstring Stretch 3 reps;30 seconds  supine with strap    Quad Stretch 3 reps;30 seconds  prone with strap    ITB Stretch 3 reps;30 seconds  supine with strap      Knee/Hip Exercises: Standing   Other Standing Knee Exercises standing wt shift side to side feet even and fwd/back stagger stance  Cryotherapy   Number Minutes Cryotherapy 15 Minutes   Cryotherapy Location Knee  Lt   Type of Cryotherapy Ice pack     Electrical Stimulation   Electrical Stimulation Location Lt  knee    Electrical Stimulation Action IFC   Electrical Stimulation Parameters to tolerance   Electrical Stimulation Goals Pain;Edema;Tone                PT Education - 03/01/16 1523    Education provided Yes   Education Details HEP;TENs info    Person(s) Educated Patient   Methods Explanation;Demonstration;Tactile cues;Verbal cues;Handout   Comprehension Verbalized understanding;Returned demonstration;Verbal cues required;Tactile cues required             PT Long Term Goals - 03/01/16 1631      PT LONG TERM GOAL #1   Title Improve gait pattern with patient to demonstrate normal gait pattern without assistive device 04/12/16   Time 6   Period Weeks   Status New     PT LONG TERM GOAL #2   Title Increase Lt knee ROM 0 to 125 deg 04/12/16   Time 5   Period Weeks   Status New     PT LONG TERM GOAL #3   Title 5/5 strength Lt LE 04/12/16   Time 6   Period Weeks   Status New     PT LONG TERM GOAL #4   Title Independent in HEP 04/12/16   Time 6   Period Weeks   Status New     PT LONG TERM GOAL #5   Title Improve FOTO to </= 32% limitation 04/12/16   Time 6   Period Weeks   Status New               Plan - 03/01/16 1628    Clinical Impression Statement Kornelia presents s/p Lt TKA 02/09/16. She has abnormal gait pattern; poor posture and alignment; limited ROM/mobilty Lt LE; decreased strength Lt LE; edema; pain; limited functional activity and work tolerance. She will benefit from PT to address limitations identified.    Rehab Potential Good   PT Frequency 2x / week   PT Duration 6 weeks   PT Treatment/Interventions Patient/family education;ADLs/Self Care Home Management;Neuromuscular re-education;Cryotherapy;Electrical Stimulation;Iontophoresis 4mg /ml Dexamethasone;Moist Heat;Ultrasound;Dry needling;Manual techniques;Therapeutic activities;Therapeutic exercise   PT Next Visit Plan ROM; strengthening; gait training; stretching; funcitonal activities; manual  work; modalities as indicated.    Consulted and Agree with Plan of Care Patient      Patient will benefit from skilled therapeutic intervention in order to improve the following deficits and impairments:  Postural dysfunction, Pain, Abnormal gait, Decreased range of motion, Decreased mobility, Decreased strength, Decreased activity tolerance, Decreased balance  Visit Diagnosis: Chronic pain of left knee - Plan: PT plan of care cert/re-cert  Weakness generalized - Plan: PT plan of care cert/re-cert  Other abnormalities of gait and mobility - Plan: PT plan of care cert/re-cert     Problem List Patient Active Problem List   Diagnosis Date Noted  . Seasonal allergies   . Hypothyroid     Lanaysia Fritchman Nilda Simmer PT, MPH  03/01/2016, 4:41 PM  [facility] [address]  [place], [place], [address] Phone: [phone]   Fax:  [phone]  Name: Mandy Bond MRN: [id number] Date of Birth: [birth year]

## 2016-03-01 NOTE — Patient Instructions (Signed)
Self massage using "The stick" [employer] or [employer]   4 inch plastic ball   Stretch out strap green strap   Standing work on weight shift side to side and forward and back with each foot forward Work on releasing the knee and swinging foot through smoothly  HIP: Hamstrings - Supine   Place strap around foot. Raise leg up, keeping knee straight.  Bend opposite knee to protect back if indicated. Hold 30 seconds. 3 reps per set, 2-3 sets per day   Outer Hip Stretch: Reclined IT Band Stretch (Strap)   Strap around one foot, pull leg across body until you feel a pull or stretch, with shoulders on mat. Hold for 30 seconds. Repeat 3 times each leg. 2-3 times/day.   Quads / HF, Prone   Lie face down. Grasp one ankle with same-side hand. Use towel if needed to reach. Gently pull foot toward buttock.  Hold 30 seconds. Repeat 3 times per session. Do 2-3 sessions per day.  TENS UNIT: This is helpful for muscle pain and spasm.   Search and Purchase a TENS 7000 2nd edition at www.tenspros.com. It should be less than $30.     TENS unit instructions: Do not shower or bathe with the unit on Turn the unit off before removing electrodes or batteries If the electrodes lose stickiness add a drop of water to the electrodes after they are disconnected from the unit and place on plastic sheet. If you continued to have difficulty, call the TENS unit company to purchase more electrodes. Do not apply lotion on the skin area prior to use. Make sure the skin is clean and dry as this will help prolong the life of the electrodes. After use, always check skin for unusual red areas, rash or other skin difficulties. If there are any skin problems, does not apply electrodes to the same area. Never remove the electrodes from the unit by pulling the wires. Do not use the TENS unit or electrodes other than as directed. Do not change electrode placement without consultating your therapist or physician. Keep 2  fingers with between each electrode.

## 2016-03-06 ENCOUNTER — Ambulatory Visit: Payer: BLUE CROSS/BLUE SHIELD | Attending: Orthopaedic Surgery

## 2016-03-06 DIAGNOSIS — R2689 Other abnormalities of gait and mobility: Secondary | ICD-10-CM | POA: Insufficient documentation

## 2016-03-06 DIAGNOSIS — M25562 Pain in left knee: Secondary | ICD-10-CM | POA: Diagnosis not present

## 2016-03-06 DIAGNOSIS — G8929 Other chronic pain: Secondary | ICD-10-CM | POA: Insufficient documentation

## 2016-03-06 DIAGNOSIS — R531 Weakness: Secondary | ICD-10-CM | POA: Insufficient documentation

## 2016-03-06 NOTE — Therapy (Signed)
[facility] [address]  [place], [place], [address] Phone: [phone]   Fax:  [phone]  Physical Therapy Treatment  Patient Details  Name: Mandy Bond MRN: [id number] Date of Birth: 16-Aug-1965 Referring Provider: Dr Nickola Major   Encounter Date: 03/06/2016      PT End of Session - 03/06/16 1458    Visit Number 2   Number of Visits 12   Date for PT Re-Evaluation 04/12/16   PT Start Time [id number]   PT Stop Time 1542   PT Time Calculation (min) 55 min   Activity Tolerance Patient tolerated treatment well      Past Medical History:  Diagnosis Date  . AGUS favor benign 2007   w/u suggested endometriosis of cervix  . Allergy   . Arthritis    knees  . 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 GERD (gastroesophageal reflux disease)   . Hypothyroid    pt denies- no medication for this   . Seasonal allergies   . Ulcer ([place]) 2005-2006   past hx long ago per pt.  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 ABDOMINAL HYSTERECTOMY  12/2007   TAH,BSO  . ANKLE SURGERY    . CHOLECYSTECTOMY    . DILATION AND CURETTAGE OF UTERUS    . HYSTEROSCOPY  12/2000, 08/2005   AND D&C  . KNEE SURGERY Bilateral   . NEURECTOMY FOOT    . OOPHORECTOMY     BSO  . PELVIC LAPAROSCOPY     DIAG LAP/STAGE 4 ENDOMETRIOSIS  . TONSILLECTOMY      There were no vitals filed for this visit.      Subjective Assessment - 03/06/16 1456    Subjective Pt. reporting she has been able to perform HEP without issue with exception of prone quad stretch.     Patient Stated Goals walk normally; exercise;  get back into the gym; get rid of pain    Currently in Pain? Yes   Pain Score 1    Pain Location Knee   Pain Orientation Left   Pain Descriptors / Indicators Sharp;Throbbing   Pain Type Surgical pain    Multiple Pain Sites No            [facility] PT Assessment - 03/06/16 1520      AROM   Left Knee Extension -2   Left Knee Flexion 98  supine; 91 dg seated              [facility] Adult PT Treatment/Exercise - 03/06/16 1504      Ambulation/Gait   Ambulation/Gait Yes   Ambulation Distance (Feet) 200 Feet   Assistive device Straight cane   Gait Pattern Decreased stance time - left;Decreased hip/knee flexion - left;Decreased weight shift to left   Ambulation Surface Level;Indoor   Gait Comments Some cueing today for increased stance time on L LE with mild antalgic gait still noted      Knee/Hip Exercises: Stretches   Passive Hamstring Stretch 30 seconds;2 reps   Quad Stretch 30 seconds;2 reps   Quad Stretch Limitations Prone with strap   ITB Stretch 30 seconds;2 reps  supine with strap      Knee/Hip Exercises: Aerobic   Recumbent Bike Lvl 1, 6 min; half revolutions for ROM     Knee/Hip Exercises: Standing  Terminal Knee Extension Limitations 5" x 10 reps with wall ball squeeze; 2nd set with black TB TKE 3" x 15 reps   Step Down Left;1 set;5 reps;Step Height: 4"   Step Down Limitations 2 ski pole support; limited control here   Other Standing Knee Exercises Standing alternating 6" black bolster step over x 10 reps each side focusing on L TKE      Knee/Hip Exercises: Seated   Ball Squeeze 5" hold x 10 reps     Knee/Hip Exercises: Supine   Straight Leg Raises AROM;Left;15 reps;Strengthening;2 sets     Modalities   Modalities Vasopneumatic     Vasopneumatic   Number Minutes Vasopneumatic  15 minutes   Vasopnuematic Location  Knee  L   Vasopneumatic Pressure Medium   Vasopneumatic Temperature  lowest temp.       Manual Therapy   Manual Therapy Joint mobilization   Joint Mobilization L patellar mobs all directions with focus on inferior/superior mobs to increase flexion/extension ROM   Soft tissue mobilization --            PT Long Term Goals - 03/06/16 1459       PT LONG TERM GOAL #1   Title Improve gait pattern with patient to demonstrate normal gait pattern without assistive device 04/12/16   Time 6   Period Weeks   Status On-going     PT LONG TERM GOAL #2   Title Increase Lt knee ROM 0 to 125 deg 04/12/16   Time 5   Period Weeks   Status On-going     PT LONG TERM GOAL #3   Title 5/5 strength Lt LE 04/12/16   Time 6   Period Weeks   Status On-going     PT LONG TERM GOAL #4   Title Independent in HEP 04/12/16   Time 6   Period Weeks   Status On-going     PT LONG TERM GOAL #5   Title Improve FOTO to </= 32% limitation 04/12/16   Time 6   Period Weeks   Status On-going               Plan - 03/06/16 1500    Clinical Impression Statement Pt. demonstrating improved L knee flexion AROM today (supine) 98 dg flexion.  Pt. reporting consistent HEP performance and demo'd good technique with all activities.  Pt. instructed to use strap or towel for prone quad stretch due to pt. report of using elastic TB for this.  Pt. tolerating standing TKE strengthening activity today well and demonstrating good overall mechanics with gait training with SPC.  Pt. still requiring some cueing for increased stance time on L LE with mild antalgic gait still present.  Pt. asking therapist when she will be able to wean off [place] today.  Pt. instructed to discuss this possibility with supervising PT at next treatment.  Ice/compression to end treatment to promote decrease in post exercise swelling and pain.     PT Treatment/Interventions Patient/family education;ADLs/Self Care Home Management;Neuromuscular re-education;Cryotherapy;Electrical Stimulation;Iontophoresis 4mg /ml Dexamethasone;Moist Heat;Ultrasound;Dry needling;Manual techniques;Therapeutic activities;Therapeutic exercise   PT Next Visit Plan ROM; strengthening; gait training; stretching; funcitonal activities; manual work; modalities as indicated.       Patient will benefit from skilled therapeutic  intervention in order to improve the following deficits and impairments:  Postural dysfunction, Pain, Abnormal gait, Decreased range of motion, Decreased mobility, Decreased strength, Decreased activity tolerance, Decreased balance  Visit Diagnosis: Chronic pain of left knee  Weakness generalized  Other abnormalities  of gait and mobility     Problem List Patient Active Problem List   Diagnosis Date Noted  . Seasonal allergies   . Hypothyroid     Bess Harvest, [place] 03/06/16 4:54 PM  [facility] [address]  [place], [place], [address] Phone: [phone]   Fax:  [phone]  Name: Mandy Bond MRN: [id number] Date of Birth: [birth year]

## 2016-03-08 ENCOUNTER — Ambulatory Visit: Payer: BLUE CROSS/BLUE SHIELD | Admitting: Physical Therapy

## 2016-03-08 DIAGNOSIS — G8929 Other chronic pain: Secondary | ICD-10-CM | POA: Diagnosis not present

## 2016-03-08 DIAGNOSIS — M25562 Pain in left knee: Secondary | ICD-10-CM | POA: Diagnosis not present

## 2016-03-08 DIAGNOSIS — R2689 Other abnormalities of gait and mobility: Secondary | ICD-10-CM

## 2016-03-08 DIAGNOSIS — R531 Weakness: Secondary | ICD-10-CM | POA: Diagnosis not present

## 2016-03-08 NOTE — Therapy (Signed)
[facility] [address]  [place], [place], [address] Phone: [phone]   Fax:  [phone]  Physical Therapy Treatment  Patient Details  Name: Mandy Bond MRN: [id number] Date of Birth: [birth year] Referring Provider: Dr Nickola Major   Encounter Date: 03/08/2016      PT End of Session - 03/08/16 1720    Visit Number 3   Number of Visits 12   Date for PT Re-Evaluation 04/12/16   PT Start Time 1528   PT Stop Time 1632   PT Time Calculation (min) 64 min   Activity Tolerance Patient tolerated treatment well   Behavior During Therapy [facility] for tasks assessed/performed      Past Medical History:  Diagnosis Date  . AGUS favor benign 2007   w/u suggested endometriosis of cervix  . Allergy   . Arthritis    knees  . 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 GERD (gastroesophageal reflux disease)   . Hypothyroid    pt denies- no medication for this   . Seasonal allergies   . Ulcer ([place]) 2005-2006   past hx long ago per pt.  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 ABDOMINAL HYSTERECTOMY  12/2007   TAH,BSO  . ANKLE SURGERY    . CHOLECYSTECTOMY    . DILATION AND CURETTAGE OF UTERUS    . HYSTEROSCOPY  12/2000, 08/2005   AND D&C  . KNEE SURGERY Bilateral   . NEURECTOMY FOOT    . OOPHORECTOMY     BSO  . PELVIC LAPAROSCOPY     DIAG LAP/STAGE 4 ENDOMETRIOSIS  . TONSILLECTOMY      There were no vitals filed for this visit.      Subjective Assessment - 03/08/16 1716    Subjective patient reporting increased "spasm and catching" around anterior and posterior knee - she thinks may be caused due to sitting for long periods of time.    Pertinent History Scope bilat knees ~ 5 years ago; scope Rt ankle ~ 11 year ago    Patient Stated Goals walk normally; exercise;  get back  into the gym; get rid of pain    Currently in Pain? Yes   Pain Score 3    Pain Location Knee   Pain Orientation Left   Pain Descriptors / Indicators Sharp   Pain Type Surgical pain                         [facility] Adult PT Treatment/Exercise - 03/08/16 0001      Knee/Hip Exercises: Aerobic   Recumbent Bike level 1 - partial revolutions for ROM     Knee/Hip Exercises: Standing   Forward Step Up 1 set;20 reps;Hand Hold: 0;Step Height: 4"   Functional Squat 15 reps   Functional Squat Limitations Pt providing medial patellar glide with reduced pain and "spasm"   Other Standing Knee Exercises toe clears - L LE on 8" step x 20     Knee/Hip Exercises: Seated   Long Arc Quad Strengthening;Left;1 set;15 reps;Weights   Long Arc Quad Weight 2 lbs.   Long [employer] Limitations 1 set with 2#; 1 set with ball squeeze     Knee/Hip Exercises: Supine   Heel Slides AAROM;Left;10 reps   Bridges Limitations 15 reps   Straight  Leg Raises Strengthening;Left;15 reps     Modalities   Modalities Vasopneumatic     Vasopneumatic   Number Minutes Vasopneumatic  15 minutes   Vasopnuematic Location  Knee   Vasopneumatic Pressure High   Vasopneumatic Temperature  lowest temp.       Manual Therapy   Manual Therapy Joint mobilization;Taping   Joint Mobilization L patella - inferior/superior   Kinesiotex Facilitate Muscle     Kinesiotix   Facilitate Muscle  diamond pattern to help facilitate medial patella glide                     PT Long Term Goals - 03/06/16 1459      PT LONG TERM GOAL #1   Title Improve gait pattern with patient to demonstrate normal gait pattern without assistive device 04/12/16   Time 6   Period Weeks   Status On-going     PT LONG TERM GOAL #2   Title Increase Lt knee ROM 0 to 125 deg 04/12/16   Time 5   Period Weeks   Status On-going     PT LONG TERM GOAL #3   Title 5/5 strength Lt LE 04/12/16   Time 6   Period Weeks   Status On-going      PT LONG TERM GOAL #4   Title Independent in HEP 04/12/16   Time 6   Period Weeks   Status On-going     PT LONG TERM GOAL #5   Title Improve FOTO to </= 32% limitation 04/12/16   Time 6   Period Weeks   Status On-going               Plan - 03/08/16 1720    Clinical Impression Statement patient with subjective reports of L knee "spasms and catching" throughout her day today - no known reason. Patient doing well with all stengthening progressions. PT applying medial patellar glide during mini squats todya with reduced sensation of spasms. Taping to L knee to help facilitate medial glide of L patella.    PT Treatment/Interventions Patient/family education;ADLs/Self Care Home Management;Neuromuscular re-education;Cryotherapy;Electrical Stimulation;Iontophoresis 4mg /ml Dexamethasone;Moist Heat;Ultrasound;Dry needling;Manual techniques;Therapeutic activities;Therapeutic exercise   PT Next Visit Plan ROM; strengthening; gait training; stretching; funcitonal activities; manual work; modalities as indicated.    Consulted and Agree with Plan of Care Patient      Patient will benefit from skilled therapeutic intervention in order to improve the following deficits and impairments:  Postural dysfunction, Pain, Abnormal gait, Decreased range of motion, Decreased mobility, Decreased strength, Decreased activity tolerance, Decreased balance  Visit Diagnosis: Chronic pain of left knee  Weakness generalized  Other abnormalities of gait and mobility     Problem List Patient Active Problem List   Diagnosis Date Noted  . Seasonal allergies   . Hypothyroid     Lanney Gins, PT, DPT 03/08/16 5:23 PM   [facility] [address]  [place], [place], [address] Phone: [phone]   Fax:  [phone]  Name: Mandy Bond MRN: [id number] Date of Birth: August 12, 1965

## 2016-03-13 ENCOUNTER — Ambulatory Visit: Payer: BLUE CROSS/BLUE SHIELD

## 2016-03-16 ENCOUNTER — Ambulatory Visit: Payer: BLUE CROSS/BLUE SHIELD | Admitting: Physical Therapy

## 2016-03-16 DIAGNOSIS — R531 Weakness: Secondary | ICD-10-CM | POA: Diagnosis not present

## 2016-03-16 DIAGNOSIS — M25562 Pain in left knee: Secondary | ICD-10-CM | POA: Diagnosis not present

## 2016-03-16 DIAGNOSIS — G8929 Other chronic pain: Secondary | ICD-10-CM | POA: Diagnosis not present

## 2016-03-16 DIAGNOSIS — R2689 Other abnormalities of gait and mobility: Secondary | ICD-10-CM | POA: Diagnosis not present

## 2016-03-16 NOTE — Therapy (Signed)
[facility] [address]  [place], [place], [address] Phone: [phone]   Fax:  [phone]  Physical Therapy Treatment  Patient Details  Name: Mandy Bond MRN: [id number] Date of Birth: Feb 01, 1965 Referring Provider: Dr Nickola Major   Encounter Date: 03/16/2016      PT End of Session - 03/16/16 1632    Visit Number 4   Number of Visits 12   Date for PT Re-Evaluation 04/12/16   PT Start Time 6761   PT Stop Time 1626   PT Time Calculation (min) 56 min   Activity Tolerance Patient tolerated treatment well   Behavior During Therapy [facility] for tasks assessed/performed      Past Medical History:  Diagnosis Date  . AGUS favor benign 2007   w/u suggested endometriosis of cervix  . Allergy   . Arthritis    knees  . 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 GERD (gastroesophageal reflux disease)   . Hypothyroid    pt denies- no medication for this   . Seasonal allergies   . Ulcer ([place]) 2005-2006   past hx long ago per pt.  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 ABDOMINAL HYSTERECTOMY  12/2007   TAH,BSO  . ANKLE SURGERY    . CHOLECYSTECTOMY    . DILATION AND CURETTAGE OF UTERUS    . HYSTEROSCOPY  12/2000, 08/2005   AND D&C  . KNEE SURGERY Bilateral   . NEURECTOMY FOOT    . OOPHORECTOMY     BSO  . PELVIC LAPAROSCOPY     DIAG LAP/STAGE 4 ENDOMETRIOSIS  . TONSILLECTOMY      There were no vitals filed for this visit.      Subjective Assessment - 03/16/16 1532    Subjective Reduced pain since last visit - reduced "spasms"   Pertinent History Scope bilat knees ~ 5 years ago; scope Rt ankle ~ 11 year ago    Patient Stated Goals walk normally; exercise;  get back into the gym; get rid of pain    Currently in Pain? Yes   Pain Score 1    Pain Location Knee   Pain  Orientation Left   Pain Descriptors / Indicators Aching;Tightness   Pain Type Surgical pain                         [facility] Adult PT Treatment/Exercise - 03/16/16 1538      Knee/Hip Exercises: Stretches   Sports administrator Left;3 reps;60 seconds   Quad Stretch Limitations Prone with strap     Knee/Hip Exercises: Aerobic   Recumbent Bike Level 1 - full revolutions x 7 minutes     Knee/Hip Exercises: Machines for Strengthening   Cybex Knee Extension 15# B LE x 15 reps; 10# L LE eccentric   Cybex Knee Flexion 20# B LE x 15 reps     Knee/Hip Exercises: Standing   Forward Step Up Left;1 set;20 reps;Hand Hold: 0;Step Height: 6"   Forward Step Up Limitations with eccentric lowering      Knee/Hip Exercises: Seated   Other Seated Knee/Hip Exercises fitter - 2 blue bands - L LE x 20 reps     Knee/Hip Exercises: Supine   Bridges Limitations 15 reps   Straight Leg Raises Strengthening;Left;2 sets;15 reps  Straight Leg Raises Limitations 2#   Other Supine Knee/Hip Exercises straight leg bridge on orange ball - 15 reps     Modalities   Modalities Vasopneumatic     Vasopneumatic   Number Minutes Vasopneumatic  15 minutes   Vasopnuematic Location  Knee   Vasopneumatic Pressure High   Vasopneumatic Temperature  lowest temp.                       PT Long Term Goals - 03/06/16 1459      PT LONG TERM GOAL #1   Title Improve gait pattern with patient to demonstrate normal gait pattern without assistive device 04/12/16   Time 6   Period Weeks   Status On-going     PT LONG TERM GOAL #2   Title Increase Lt knee ROM 0 to 125 deg 04/12/16   Time 5   Period Weeks   Status On-going     PT LONG TERM GOAL #3   Title 5/5 strength Lt LE 04/12/16   Time 6   Period Weeks   Status On-going     PT LONG TERM GOAL #4   Title Independent in HEP 04/12/16   Time 6   Period Weeks   Status On-going     PT LONG TERM GOAL #5   Title Improve FOTO to </= 32% limitation  04/12/16   Time 6   Period Weeks   Status On-going               Plan - 03/16/16 1633    Clinical Impression Statement Subjective reports of improved pain. Today able to make full revolutions on bike demonstrating improved ROM (flexion at 106 degrees). Good progression of strengthening with appropriate muscle soreness and fatigue. Discussion with patient to begin ambulating without PSC as patient demonstrates good gait pattern without instability.    PT Treatment/Interventions Patient/family education;ADLs/Self Care Home Management;Neuromuscular re-education;Cryotherapy;Electrical Stimulation;Iontophoresis 4mg /ml Dexamethasone;Moist Heat;Ultrasound;Dry needling;Manual techniques;Therapeutic activities;Therapeutic exercise   PT Next Visit Plan ROM; strengthening; gait training; stretching; funcitonal activities; manual work; modalities as indicated.    Consulted and Agree with Plan of Care Patient      Patient will benefit from skilled therapeutic intervention in order to improve the following deficits and impairments:  Postural dysfunction, Pain, Abnormal gait, Decreased range of motion, Decreased mobility, Decreased strength, Decreased activity tolerance, Decreased balance  Visit Diagnosis: Chronic pain of left knee  Weakness generalized  Other abnormalities of gait and mobility     Problem List Patient Active Problem List   Diagnosis Date Noted  . Seasonal allergies   . Hypothyroid      Lanney Gins, PT, DPT 03/16/16 4:36 PM   [facility] [address]  [place], [place], [address] Phone: [phone]   Fax:  [phone]  Name: Mandy Bond MRN: [id number] Date of Birth: Nov 27, 1965

## 2016-03-20 ENCOUNTER — Ambulatory Visit: Payer: BLUE CROSS/BLUE SHIELD

## 2016-03-20 DIAGNOSIS — G8929 Other chronic pain: Secondary | ICD-10-CM | POA: Diagnosis not present

## 2016-03-20 DIAGNOSIS — R531 Weakness: Secondary | ICD-10-CM

## 2016-03-20 DIAGNOSIS — R2689 Other abnormalities of gait and mobility: Secondary | ICD-10-CM

## 2016-03-20 DIAGNOSIS — M25562 Pain in left knee: Secondary | ICD-10-CM | POA: Diagnosis not present

## 2016-03-20 NOTE — Therapy (Signed)
[facility] [address]  [place], [place], [address] Phone: [phone]   Fax:  [phone]  Physical Therapy Treatment  Patient Details  Name: Mandy Bond MRN: [id number] Date of Birth: 08/03/1965 Referring Provider: Dr. Nickola Major   Encounter Date: 03/20/2016      PT End of Session - 03/20/16 1448    Visit Number 5   Number of Visits 12   Date for PT Re-Evaluation 04/12/16   PT Start Time 2025   PT Stop Time 1538   PT Time Calculation (min) 55 min   Activity Tolerance Patient tolerated treatment well   Behavior During Therapy [facility] for tasks assessed/performed      Past Medical History:  Diagnosis Date  . AGUS favor benign 2007   w/u suggested endometriosis of cervix  . Allergy   . Arthritis    knees  . 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 GERD (gastroesophageal reflux disease)   . Hypothyroid    pt denies- no medication for this   . Seasonal allergies   . Ulcer ([place]) 2005-2006   past hx long ago per pt.  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 ABDOMINAL HYSTERECTOMY  12/2007   TAH,BSO  . ANKLE SURGERY    . CHOLECYSTECTOMY    . DILATION AND CURETTAGE OF UTERUS    . HYSTEROSCOPY  12/2000, 08/2005   AND D&C  . KNEE SURGERY Bilateral   . NEURECTOMY FOOT    . OOPHORECTOMY     BSO  . PELVIC LAPAROSCOPY     DIAG LAP/STAGE 4 ENDOMETRIOSIS  . TONSILLECTOMY      There were no vitals filed for this visit.      Subjective Assessment - 03/20/16 1647    Subjective Pt. reporting uncomfortable "clicking" sensation in L knee while walking however low pain level in L knee today.     Patient Stated Goals walk normally; exercise;  get back into the gym; get rid of pain    Currently in Pain? Yes   Pain Score 1    Pain Location Knee   Pain Orientation Left    Pain Descriptors / Indicators Aching;Tightness   Pain Type Surgical pain   Pain Onset More than a month ago   Pain Frequency Constant   Multiple Pain Sites No            [facility] PT Assessment - 03/20/16 1447      Assessment   Referring Provider Dr. Nickola Major    Next MD Visit 03/27/16                     [facility] Adult PT Treatment/Exercise - 03/20/16 1501      Knee/Hip Exercises: Stretches   Passive Hamstring Stretch 30 seconds;2 reps   Passive Hamstring Stretch Limitations with strap   Quad Stretch Left;3 reps;60 seconds   Quad Stretch Limitations Prone with strap   Gastroc Stretch Left;2 reps;30 seconds   Gastroc Stretch Limitations on Prostretch      Knee/Hip Exercises: Aerobic   Recumbent Bike Level 1 - full revolutions x 7 minutes     Knee/Hip Exercises: Standing   Lateral Step Up Right;Left;1 set;20 reps;Hand Hold: 1;Step Height: 6"   Lateral Step Up Limitations 1 ski pole support    Forward Step Up  Left;1 set;20 reps;Hand Hold: 0;Step Height: 6"   Forward Step Up Limitations with eccentric lowering    Step Down Left;10 reps;Hand Hold: 2;Step Height: 4"   Step Down Limitations 2 ski pole support    Functional Squat 15 reps   Functional Squat Limitations on TRX + heel raise   Other Standing Knee Exercises L knee flexion stretch with heel on peanut p-ball and strap x 10 reps     Knee/Hip Exercises: Supine   Straight Leg Raises Strengthening;Left;10 reps;2 sets   Straight Leg Raises Limitations 3#      Vasopneumatic   Number Minutes Vasopneumatic  10 minutes   Vasopnuematic Location  Knee   Vasopneumatic Pressure Medium   Vasopneumatic Temperature  lowest temp.       Manual Therapy   Manual Therapy Joint mobilization;Passive ROM   Joint Mobilization L patella - inferior/superior   Passive ROM L knee gentle flexion stretching with therapist                      PT Long Term Goals - 03/06/16 1459      PT LONG TERM GOAL #1   Title  Improve gait pattern with patient to demonstrate normal gait pattern without assistive device 04/12/16   Time 6   Period Weeks   Status On-going     PT LONG TERM GOAL #2   Title Increase Lt knee ROM 0 to 125 deg 04/12/16   Time 5   Period Weeks   Status On-going     PT LONG TERM GOAL #3   Title 5/5 strength Lt LE 04/12/16   Time 6   Period Weeks   Status On-going     PT LONG TERM GOAL #4   Title Independent in HEP 04/12/16   Time 6   Period Weeks   Status On-going     PT LONG TERM GOAL #5   Title Improve FOTO to </= 32% limitation 04/12/16   Time 6   Period Weeks   Status On-going               Plan - 03/20/16 1450    Clinical Impression Statement Teri tolerating progression of reps with forward step up and addition of lateral step up and TRX squat well today.  Pt. reporting daily adherence to HEP and is very compliant and motivated with therapy at this point.  Quad/VMO strengthening activity today with 4" eccentric lowering.  Pt. demonstrating improved quad control with stepping today.  Pt. gait normalizing well without AD only requiring cueing x 1 for even wt. shift.  Pt. progressing well at this point.  Ice/compression to end treatment to minimize post-exercise swelling and pain.  Pt. will continue to benefit from further skilled therapy to maximize LE strength, ROM, and function.     PT Treatment/Interventions Patient/family education;ADLs/Self Care Home Management;Neuromuscular re-education;Cryotherapy;Electrical Stimulation;Iontophoresis 4mg /ml Dexamethasone;Moist Heat;Ultrasound;Dry needling;Manual techniques;Therapeutic activities;Therapeutic exercise   PT Next Visit Plan ROM; strengthening; gait training; stretching; funcitonal activities; manual work; modalities as indicated.       Patient will benefit from skilled therapeutic intervention in order to improve the following deficits and impairments:  Postural dysfunction, Pain, Abnormal gait, Decreased range of  motion, Decreased mobility, Decreased strength, Decreased activity tolerance, Decreased balance  Visit Diagnosis: Chronic pain of left knee  Weakness generalized  Other abnormalities of gait and mobility     Problem List Patient Active Problem List   Diagnosis Date Noted  . Seasonal allergies   .  Hypothyroid     Bess Harvest, [place] 03/20/16 4:59 PM  [facility] [address]  [place], [place], [address] Phone: [phone]   Fax:  [phone]  Name: Mandy Bond MRN: [id number] Date of Birth: 1965/10/27

## 2016-03-22 ENCOUNTER — Ambulatory Visit: Payer: BLUE CROSS/BLUE SHIELD | Admitting: Physical Therapy

## 2016-03-22 DIAGNOSIS — R2689 Other abnormalities of gait and mobility: Secondary | ICD-10-CM

## 2016-03-22 DIAGNOSIS — G8929 Other chronic pain: Secondary | ICD-10-CM | POA: Diagnosis not present

## 2016-03-22 DIAGNOSIS — M25562 Pain in left knee: Secondary | ICD-10-CM | POA: Diagnosis not present

## 2016-03-22 DIAGNOSIS — R531 Weakness: Secondary | ICD-10-CM | POA: Diagnosis not present

## 2016-03-22 NOTE — Therapy (Addendum)
[facility] [address]  [place], [place], [address] Phone: [phone]   Fax:  [phone]  Physical Therapy Treatment  Patient Details  Name: Mandy Bond MRN: [id number] Date of Birth: [birth year] Referring Provider: Dr. Nickola Major   Encounter Date: 03/22/2016      PT End of Session - 03/22/16 1538    Visit Number 6   Number of Visits 12   Date for PT Re-Evaluation 04/12/16   PT Start Time 4196   PT Stop Time 1633   PT Time Calculation (min) 59 min   Activity Tolerance Patient tolerated treatment well   Behavior During Therapy [facility] for tasks assessed/performed      Past Medical History:  Diagnosis Date  . AGUS favor benign 2007   w/u suggested endometriosis of cervix  . Allergy   . Arthritis    knees  . 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 GERD (gastroesophageal reflux disease)   . Hypothyroid    pt denies- no medication for this   . Seasonal allergies   . Ulcer ([place]) 2005-2006   past hx long ago per pt.  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 ABDOMINAL HYSTERECTOMY  12/2007   TAH,BSO  . ANKLE SURGERY    . CHOLECYSTECTOMY    . DILATION AND CURETTAGE OF UTERUS    . HYSTEROSCOPY  12/2000, 08/2005   AND D&C  . KNEE SURGERY Bilateral   . NEURECTOMY FOOT    . OOPHORECTOMY     BSO  . PELVIC LAPAROSCOPY     DIAG LAP/STAGE 4 ENDOMETRIOSIS  . TONSILLECTOMY      There were no vitals filed for this visit.      Subjective Assessment - 03/22/16 1537    Subjective Having some "achiness" in both knees  -still unable to sleep. Feels like once her knee is in one position for too long pain is exacerbated with movement.    Pertinent History Scope bilat knees ~ 5 years ago; scope Rt ankle ~ 11 year ago    Patient Stated Goals walk normally; exercise;  get  back into the gym; get rid of pain    Currently in Pain? Yes   Pain Score 3    Pain Location Knee   Pain Orientation Right;Left   Pain Descriptors / Indicators Aching;Tightness   Pain Type Surgical pain            [facility] PT Assessment - 03/22/16 0001      AROM   Left Knee Extension -2   Left Knee Flexion 114                     [facility] Adult PT Treatment/Exercise - 03/22/16 1539      Knee/Hip Exercises: Aerobic   Recumbent Bike Level 1 - full revolutions x 7 minutes     Knee/Hip Exercises: Machines for Strengthening   Cybex Knee Extension 25# B LE x 15 reps; 15# L LE eccentric x 15 reps   Cybex Knee Flexion 25# B LE x 15 reps; 15# L LE eccentric x 15 reps   Cybex Leg Press 35# B LE x 15 reps; 15# L LE eccentric x 15 reps      Knee/Hip Exercises: Standing   Heel Raises Left;15 reps   Heel Raises  Limitations eccentric   Other Standing Knee Exercises L LE step overs (fwd/bwd) on Airex x 20 reps     Knee/Hip Exercises: Seated   Other Seated Knee/Hip Exercises Fitter - 1 black and 1 blue x 20 reps     Modalities   Modalities Vasopneumatic     Vasopneumatic   Number Minutes Vasopneumatic  15 minutes   Vasopnuematic Location  Knee   Vasopneumatic Pressure High   Vasopneumatic Temperature  lowest temp.                       PT Long Term Goals - 03/06/16 1459      PT LONG TERM GOAL #1   Title Improve gait pattern with patient to demonstrate normal gait pattern without assistive device 04/12/16   Time 6   Period Weeks   Status On-going     PT LONG TERM GOAL #2   Title Increase Lt knee ROM 0 to 125 deg 04/12/16   Time 5   Period Weeks   Status On-going     PT LONG TERM GOAL #3   Title 5/5 strength Lt LE 04/12/16   Time 6   Period Weeks   Status On-going     PT LONG TERM GOAL #4   Title Independent in HEP 04/12/16   Time 6   Period Weeks   Status On-going     PT LONG TERM GOAL #5   Title Improve FOTO to </= 32% limitation 04/12/16    Time 6   Period Weeks   Status On-going               Plan - 03/22/16 1635    Clinical Impression Statement Patient today with continued pain of L knee, as well as some soreness of R knee, which she attributes to overuse. Patient continuing to demonstrate good adherence with HEP and stretching program with AROM of L knee currenlty at -2-114. Patient doing well with all eccentric strengthening today, as well as going over gym equipment as patient wishes to start returning to the gym. Patient today with some difficulty with step over foam likely related to end range quad weakness as general soreness of L knee. Patient to continue to benefit from PT to maximize function and mobility.    PT Treatment/Interventions Patient/family education;ADLs/Self Care Home Management;Neuromuscular re-education;Cryotherapy;Electrical Stimulation;Iontophoresis 4mg /ml Dexamethasone;Moist Heat;Ultrasound;Dry needling;Manual techniques;Therapeutic activities;Therapeutic exercise   PT Next Visit Plan ROM; strengthening; gait training; stretching; funcitonal activities; manual work; modalities as indicated.    Consulted and Agree with Plan of Care Patient      Patient will benefit from skilled therapeutic intervention in order to improve the following deficits and impairments:  Postural dysfunction, Pain, Abnormal gait, Decreased range of motion, Decreased mobility, Decreased strength, Decreased activity tolerance, Decreased balance  Visit Diagnosis: Chronic pain of left knee  Weakness generalized  Other abnormalities of gait and mobility     Problem List Patient Active Problem List   Diagnosis Date Noted  . Seasonal allergies   . Hypothyroid     Lanney Gins, PT, DPT 03/22/16 4:46 PM   [facility] [address]  [place], [place], [address] Phone: [phone]   Fax:  [phone]  Name: Mandy Bond MRN: [id number] Date of  Birth: June 13, 1965

## 2016-03-27 ENCOUNTER — Ambulatory Visit: Payer: BLUE CROSS/BLUE SHIELD

## 2016-03-27 DIAGNOSIS — M25562 Pain in left knee: Principal | ICD-10-CM

## 2016-03-27 DIAGNOSIS — G8929 Other chronic pain: Secondary | ICD-10-CM | POA: Diagnosis not present

## 2016-03-27 DIAGNOSIS — R531 Weakness: Secondary | ICD-10-CM

## 2016-03-27 DIAGNOSIS — R2689 Other abnormalities of gait and mobility: Secondary | ICD-10-CM

## 2016-03-27 NOTE — Therapy (Signed)
[facility] [address]  [place], [place], [address] Phone: [phone]   Fax:  [phone]  Physical Therapy Treatment  Patient Details  Name: Mandy Bond MRN: [id number] Date of Birth: May 05, 1965 Referring Provider: Dr. Nickola Major   Encounter Date: 03/27/2016      PT End of Session - 03/27/16 1451    Visit Number 7   Number of Visits 12   Date for PT Re-Evaluation 04/12/16   PT Start Time 3976   PT Stop Time 1527   PT Time Calculation (min) 40 min   Activity Tolerance Patient tolerated treatment well   Behavior During Therapy [facility] for tasks assessed/performed      Past Medical History:  Diagnosis Date  . AGUS favor benign 2007   w/u suggested endometriosis of cervix  . Allergy   . Arthritis    knees  . 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 GERD (gastroesophageal reflux disease)   . Hypothyroid    pt denies- no medication for this   . Seasonal allergies   . Ulcer ([place]) 2005-2006   past hx long ago per pt.  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 ABDOMINAL HYSTERECTOMY  12/2007   TAH,BSO  . ANKLE SURGERY    . CHOLECYSTECTOMY    . DILATION AND CURETTAGE OF UTERUS    . HYSTEROSCOPY  12/2000, 08/2005   AND D&C  . KNEE SURGERY Bilateral   . NEURECTOMY FOOT    . OOPHORECTOMY     BSO  . PELVIC LAPAROSCOPY     DIAG LAP/STAGE 4 ENDOMETRIOSIS  . TONSILLECTOMY      There were no vitals filed for this visit.      Subjective Assessment - 03/27/16 1450    Subjective Pt. reporting MD f/u went well with MD pleased with progress.  Some trouble sleeping still.  Back to work full time this week.  Free to drive.     Patient Stated Goals walk normally; exercise;  get back into the gym; get rid of pain    Currently in Pain? No/denies   Pain Score 0-No pain   Multiple Pain Sites No                         [facility] Adult PT Treatment/Exercise - 03/27/16 1454      Knee/Hip Exercises: Stretches   Quad Stretch Left;3 reps;60 seconds   Quad Stretch Limitations Prone with strap     Knee/Hip Exercises: Aerobic   Elliptical Elliptical: 2 min, 1.0    Tread Mill Treadmill: 2 min, 2.3 mph   Recumbent Bike Level 1 - full revolutions x 4 minutes     Knee/Hip Exercises: Machines for Strengthening   Cybex Knee Extension 35# B LE x 10 reps; 20# L LE eccentric   Cybex Knee Flexion 35# B LE x 10 reps; 15 # L LE eccentric     Knee/Hip Exercises: Standing   Other Standing Knee Exercises side stepping with green TB 2 x 20 ft     Knee/Hip Exercises: Seated   Other Seated Knee/Hip Exercises Fitter leg press (2 black) 2 x 10 reps; focusing on eccentric control    Sit to Sand 1 set;10 reps;without UE support  x 8 reps; terminated due to pain; from low  box airex pad      Knee/Hip Exercises: Supine   Bridges Limitations 5" x 15 reps   Straight Leg Raises Strengthening;Left;1 set;15 reps   Straight Leg Raises Limitations 3#    Other Supine Knee/Hip Exercises straight leg bridge on orange ball - 15 reps   Other Supine Knee/Hip Exercises Side stepping on blue foam balance beam no UE support x 3 laps down/back                      PT Long Term Goals - 03/06/16 1459      PT LONG TERM GOAL #1   Title Improve gait pattern with patient to demonstrate normal gait pattern without assistive device 04/12/16   Time 6   Period Weeks   Status On-going     PT LONG TERM GOAL #2   Title Increase Lt knee ROM 0 to 125 deg 04/12/16   Time 5   Period Weeks   Status On-going     PT LONG TERM GOAL #3   Title 5/5 strength Lt LE 04/12/16   Time 6   Period Weeks   Status On-going     PT LONG TERM GOAL #4   Title Independent in HEP 04/12/16   Time 6   Period Weeks   Status On-going     PT LONG TERM GOAL #5   Title Improve FOTO to </= 32%  limitation 04/12/16   Time 6   Period Weeks   Status On-going               Plan - 03/27/16 1457    Clinical Impression Statement Pt. doing well today pain free initially and pain remained low with treatment.  Treadmill, elliptical added today upon pt. request with plans to start using these at gym.  Pt. progressing with strengthening activity well today.  Ice/compression to end treatment upon pt. request to minimize post exercise swelling and pain.  Pt. will continue to benefit from further skilled therapy to maximize LE strength, ROM, and function.   PT Treatment/Interventions Patient/family education;ADLs/Self Care Home Management;Neuromuscular re-education;Cryotherapy;Electrical Stimulation;Iontophoresis 4mg /ml Dexamethasone;Moist Heat;Ultrasound;Dry needling;Manual techniques;Therapeutic activities;Therapeutic exercise   PT Next Visit Plan ROM; strengthening; gait training; stretching; funcitonal activities; manual work; modalities as indicated.       Patient will benefit from skilled therapeutic intervention in order to improve the following deficits and impairments:  Postural dysfunction, Pain, Abnormal gait, Decreased range of motion, Decreased mobility, Decreased strength, Decreased activity tolerance, Decreased balance  Visit Diagnosis: Chronic pain of left knee  Weakness generalized  Other abnormalities of gait and mobility     Problem List Patient Active Problem List   Diagnosis Date Noted  . Seasonal allergies   . Hypothyroid     Bess Harvest, [place] 03/27/16 6:27 PM  [facility] [address]  [place], [place], [address] Phone: [phone]   Fax:  [phone]  Name: DALIYA PARCHMENT MRN: [id number] Date of Birth: 1965-01-08

## 2016-03-29 ENCOUNTER — Ambulatory Visit: Payer: BLUE CROSS/BLUE SHIELD | Admitting: Physical Therapy

## 2016-03-29 DIAGNOSIS — G8929 Other chronic pain: Secondary | ICD-10-CM | POA: Diagnosis not present

## 2016-03-29 DIAGNOSIS — R2689 Other abnormalities of gait and mobility: Secondary | ICD-10-CM | POA: Diagnosis not present

## 2016-03-29 DIAGNOSIS — M25562 Pain in left knee: Principal | ICD-10-CM

## 2016-03-29 DIAGNOSIS — R531 Weakness: Secondary | ICD-10-CM

## 2016-03-29 NOTE — Therapy (Signed)
[facility] [address]  [place], [place], [address] Phone: [phone]   Fax:  [phone]  Physical Therapy Treatment  Patient Details  Name: Mandy Bond MRN: [id number] Date of Birth: 1965/04/12 Referring Provider: Dr. Nickola Major   Encounter Date: 03/29/2016      PT End of Session - 03/29/16 1532    Visit Number 8   Number of Visits 12   Date for PT Re-Evaluation 04/12/16   PT Start Time 7591   PT Stop Time 1625   PT Time Calculation (min) 55 min   Activity Tolerance Patient tolerated treatment well   Behavior During Therapy [facility] for tasks assessed/performed      Past Medical History:  Diagnosis Date  . AGUS favor benign 2007   w/u suggested endometriosis of cervix  . Allergy   . Arthritis    knees  . 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 GERD (gastroesophageal reflux disease)   . Hypothyroid    pt denies- no medication for this   . Seasonal allergies   . Ulcer ([place]) 2005-2006   past hx long ago per pt.  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 ABDOMINAL HYSTERECTOMY  12/2007   TAH,BSO  . ANKLE SURGERY    . CHOLECYSTECTOMY    . DILATION AND CURETTAGE OF UTERUS    . HYSTEROSCOPY  12/2000, 08/2005   AND D&C  . KNEE SURGERY Bilateral   . NEURECTOMY FOOT    . OOPHORECTOMY     BSO  . PELVIC LAPAROSCOPY     DIAG LAP/STAGE 4 ENDOMETRIOSIS  . TONSILLECTOMY      There were no vitals filed for this visit.      Subjective Assessment - 03/29/16 1531    Subjective Went back to work full time - increased swelling and pain - new RX from MD for tramadol   Patient Stated Goals walk normally; exercise;  get back into the gym; get rid of pain    Currently in Pain? Yes   Pain Score 5    Pain Location Knee   Pain Orientation Left   Pain Descriptors /  Indicators Aching;Tightness   Pain Type Surgical pain                         [facility] Adult PT Treatment/Exercise - 03/29/16 1534      Knee/Hip Exercises: Aerobic   Recumbent Bike L1 x 6 minutes     Knee/Hip Exercises: Machines for Strengthening   Cybex Knee Extension 35# B LE x 15 reps   Cybex Knee Flexion 35# B LE x 15 reps     Knee/Hip Exercises: Standing   Heel Raises 15 reps   Heel Raises Limitations 1 set B; 1 set L eccentric   Functional Squat 15 reps   Other Standing Knee Exercises L LE step onto and over/back 4" step x 12 reps - some pain   Other Standing Knee Exercises modified SL lunge - R LE posterior on 9" stool x 15 reps     Knee/Hip Exercises: Seated   Long Arc Quad Strengthening;Left;1 set;15 reps;Weights   Long Arc Quad Weight 4 lbs.   Other Seated Knee/Hip Exercises Fitter - 2 black x 20 reps   Sit to Sand 15 reps;without UE support  standing on AirEx     Knee/Hip Exercises: Supine   Single Leg Bridge Left;10 reps   Straight Leg Raises Strengthening;Left;1 set;15 reps   Straight Leg Raises Limitations 3#      Modalities   Modalities Vasopneumatic     Vasopneumatic   Number Minutes Vasopneumatic  15 minutes   Vasopnuematic Location  Knee   Vasopneumatic Pressure High   Vasopneumatic Temperature  lowest temp.                       PT Long Term Goals - 03/06/16 1459      PT LONG TERM GOAL #1   Title Improve gait pattern with patient to demonstrate normal gait pattern without assistive device 04/12/16   Time 6   Period Weeks   Status On-going     PT LONG TERM GOAL #2   Title Increase Lt knee ROM 0 to 125 deg 04/12/16   Time 5   Period Weeks   Status On-going     PT LONG TERM GOAL #3   Title 5/5 strength Lt LE 04/12/16   Time 6   Period Weeks   Status On-going     PT LONG TERM GOAL #4   Title Independent in HEP 04/12/16   Time 6   Period Weeks   Status On-going     PT LONG TERM GOAL #5   Title Improve FOTO to  </= 32% limitation 04/12/16   Time 6   Period Weeks   Status On-going               Plan - 03/29/16 1533    Clinical Impression Statement Patient today with reports of increased pain and swelling of L LE. Doing well with all strengthening progressions and making good progress towards goals. Discussion with patient to elevate and ice throughout day to reduced swelling and pain at end of day with good verbal carryover.    PT Treatment/Interventions Patient/family education;ADLs/Self Care Home Management;Neuromuscular re-education;Cryotherapy;Electrical Stimulation;Iontophoresis 4mg /ml Dexamethasone;Moist Heat;Ultrasound;Dry needling;Manual techniques;Therapeutic activities;Therapeutic exercise   PT Next Visit Plan ROM; strengthening; gait training; stretching; funcitonal activities; manual work; modalities as indicated.    Consulted and Agree with Plan of Care Patient      Patient will benefit from skilled therapeutic intervention in order to improve the following deficits and impairments:  Postural dysfunction, Pain, Abnormal gait, Decreased range of motion, Decreased mobility, Decreased strength, Decreased activity tolerance, Decreased balance  Visit Diagnosis: Chronic pain of left knee  Weakness generalized  Other abnormalities of gait and mobility     Problem List Patient Active Problem List   Diagnosis Date Noted  . Seasonal allergies   . Hypothyroid      Lanney Gins, PT, DPT 03/29/16 5:18 PM   [facility] [address]  [place], [place], [address] Phone: [phone]   Fax:  [phone]  Name: Mandy Bond MRN: [id number] Date of Birth: [birth year]

## 2016-04-04 ENCOUNTER — Ambulatory Visit: Payer: BLUE CROSS/BLUE SHIELD | Attending: Orthopaedic Surgery | Admitting: Physical Therapy

## 2016-04-04 DIAGNOSIS — R2689 Other abnormalities of gait and mobility: Secondary | ICD-10-CM | POA: Diagnosis not present

## 2016-04-04 DIAGNOSIS — G8929 Other chronic pain: Secondary | ICD-10-CM | POA: Diagnosis not present

## 2016-04-04 DIAGNOSIS — M25562 Pain in left knee: Secondary | ICD-10-CM | POA: Diagnosis not present

## 2016-04-04 DIAGNOSIS — R531 Weakness: Secondary | ICD-10-CM | POA: Diagnosis not present

## 2016-04-04 NOTE — Therapy (Signed)
[facility] [address]  [place], [place], [address] Phone: [phone]   Fax:  [phone]  Physical Therapy Treatment  Patient Details  Name: Mandy Bond MRN: [id number] Date of Birth: 07/05/1965 Referring Provider: Dr. Nickola Major   Encounter Date: 04/04/2016      PT End of Session - 04/04/16 1536    Visit Number 9   Number of Visits 12   Date for PT Re-Evaluation 04/12/16   PT Start Time 1532   PT Stop Time 1628   PT Time Calculation (min) 56 min   Activity Tolerance Patient tolerated treatment well   Behavior During Therapy [facility] for tasks assessed/performed      Past Medical History:  Diagnosis Date  . AGUS favor benign 2007   w/u suggested endometriosis of cervix  . Allergy   . Arthritis    knees  . 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 GERD (gastroesophageal reflux disease)   . Hypothyroid    pt denies- no medication for this   . Seasonal allergies   . Ulcer ([place]) 2005-2006   past hx long ago per pt.  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 ABDOMINAL HYSTERECTOMY  12/2007   TAH,BSO  . ANKLE SURGERY    . CHOLECYSTECTOMY    . DILATION AND CURETTAGE OF UTERUS    . HYSTEROSCOPY  12/2000, 08/2005   AND D&C  . KNEE SURGERY Bilateral   . NEURECTOMY FOOT    . OOPHORECTOMY     BSO  . PELVIC LAPAROSCOPY     DIAG LAP/STAGE 4 ENDOMETRIOSIS  . TONSILLECTOMY      There were no vitals filed for this visit.      Subjective Assessment - 04/04/16 1650    Subjective Doing well - has been practicing stairs at work.    Pertinent History Scope bilat knees ~ 5 years ago; scope Rt ankle ~ 11 year ago    Patient Stated Goals walk normally; exercise;  get back into the gym; get rid of pain    Currently in Pain? Yes   Pain Score 2    Pain Location Knee   Pain  Orientation Left;Lateral   Pain Descriptors / Indicators Aching   Pain Type Surgical pain                         [facility] Adult PT Treatment/Exercise - 04/04/16 1539      Knee/Hip Exercises: Aerobic   Nustep L7 x 6 minutes     Knee/Hip Exercises: Machines for Strengthening   Cybex Knee Extension 45# B LE x 15; 25# L LE eccentric x 15   Cybex Knee Flexion 45# B LE x 15 reps   Cybex Leg Press 45# B LE x 15      Knee/Hip Exercises: Standing   Heel Raises 15 reps   Heel Raises Limitations L LE eccentric   Forward Lunges Left;15 reps   Forward Lunges Limitations R LE - posterior on stool   Step Down Left;15 reps;Hand Hold: 2;Step Height: 6"   Step Down Limitations eccentric   Functional Squat 10 reps   Functional Squat Limitations on BOSU - B UE support   SLS L LE 3 x 30 sec - light B UE support from PT - compliant  surface     Knee/Hip Exercises: Seated   Long Arc Quad Strengthening;Left;1 set;15 reps;Weights   Long Arc Quad Weight 4 lbs.   Other Seated Knee/Hip Exercises Fitter - 2 black x 15 reps     Knee/Hip Exercises: Supine   Bridges Limitations 15 reps   Single Leg Bridge Left;15 reps   Straight Leg Raises Strengthening;Left;1 set;15 reps   Straight Leg Raises Limitations 4#     Modalities   Modalities Vasopneumatic     Vasopneumatic   Number Minutes Vasopneumatic  15 minutes   Vasopnuematic Location  Knee   Vasopneumatic Pressure Medium   Vasopneumatic Temperature  lowest temp.                       PT Long Term Goals - 03/06/16 1459      PT LONG TERM GOAL #1   Title Improve gait pattern with patient to demonstrate normal gait pattern without assistive device 04/12/16   Time 6   Period Weeks   Status On-going     PT LONG TERM GOAL #2   Title Increase Lt knee ROM 0 to 125 deg 04/12/16   Time 5   Period Weeks   Status On-going     PT LONG TERM GOAL #3   Title 5/5 strength Lt LE 04/12/16   Time 6   Period Weeks   Status  On-going     PT LONG TERM GOAL #4   Title Independent in HEP 04/12/16   Time 6   Period Weeks   Status On-going     PT LONG TERM GOAL #5   Title Improve FOTO to </= 32% limitation 04/12/16   Time 6   Period Weeks   Status On-going               Plan - 04/04/16 1652    Clinical Impression Statement Morgin with very good progression of strength and ROM - will plan to finish up PT as planned with established plan of care. Making good progress towards all goals. Patient with primary complaints of pain with wlaking down inclines - likely needs to improve end range quad strength and will continue to progress this at upcoming visits.    PT Treatment/Interventions Patient/family education;ADLs/Self Care Home Management;Neuromuscular re-education;Cryotherapy;Electrical Stimulation;Iontophoresis 4mg /ml Dexamethasone;Moist Heat;Ultrasound;Dry needling;Manual techniques;Therapeutic activities;Therapeutic exercise   PT Next Visit Plan ROM; strengthening; gait training; stretching; funcitonal activities; manual work; modalities as indicated.    Consulted and Agree with Plan of Care Patient      Patient will benefit from skilled therapeutic intervention in order to improve the following deficits and impairments:  Postural dysfunction, Pain, Abnormal gait, Decreased range of motion, Decreased mobility, Decreased strength, Decreased activity tolerance, Decreased balance  Visit Diagnosis: Chronic pain of left knee  Weakness generalized  Other abnormalities of gait and mobility     Problem List Patient Active Problem List   Diagnosis Date Noted  . Seasonal allergies   . Hypothyroid      Lanney Gins, PT, DPT 04/04/16 4:55 PM   [facility] [address]  [place], [place], [address] Phone: [phone]   Fax:  [phone]  Name: Mandy Bond MRN: [id number] Date of Birth: October 16, 1965

## 2016-04-06 ENCOUNTER — Ambulatory Visit: Payer: BLUE CROSS/BLUE SHIELD | Admitting: Physical Therapy

## 2016-04-06 DIAGNOSIS — R531 Weakness: Secondary | ICD-10-CM | POA: Diagnosis not present

## 2016-04-06 DIAGNOSIS — R2689 Other abnormalities of gait and mobility: Secondary | ICD-10-CM

## 2016-04-06 DIAGNOSIS — G8929 Other chronic pain: Secondary | ICD-10-CM | POA: Diagnosis not present

## 2016-04-06 DIAGNOSIS — M25562 Pain in left knee: Principal | ICD-10-CM

## 2016-04-06 NOTE — Therapy (Signed)
[facility] [address]  [place], [place], [address] Phone: [phone]   Fax:  [phone]  Physical Therapy Treatment  Patient Details  Name: Mandy Bond MRN: [id number] Date of Birth: Apr 03, 1965 Referring Provider: Dr. Nickola Major   Encounter Date: 04/06/2016      PT End of Session - 04/06/16 1537    Visit Number 10   Number of Visits 12   Date for PT Re-Evaluation 04/12/16   PT Start Time 7408   PT Stop Time 1628   PT Time Calculation (min) 55 min   Activity Tolerance Patient tolerated treatment well   Behavior During Therapy [facility] for tasks assessed/performed      Past Medical History:  Diagnosis Date  . AGUS favor benign 2007   w/u suggested endometriosis of cervix  . Allergy   . Arthritis    knees  . 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 GERD (gastroesophageal reflux disease)   . Hypothyroid    pt denies- no medication for this   . Seasonal allergies   . Ulcer ([place]) 2005-2006   past hx long ago per pt.  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 ABDOMINAL HYSTERECTOMY  12/2007   TAH,BSO  . ANKLE SURGERY    . CHOLECYSTECTOMY    . DILATION AND CURETTAGE OF UTERUS    . HYSTEROSCOPY  12/2000, 08/2005   AND D&C  . KNEE SURGERY Bilateral   . NEURECTOMY FOOT    . OOPHORECTOMY     BSO  . PELVIC LAPAROSCOPY     DIAG LAP/STAGE 4 ENDOMETRIOSIS  . TONSILLECTOMY      There were no vitals filed for this visit.      Subjective Assessment - 04/06/16 1536    Subjective Having some lower leg spasming during the night - no known reason   Pertinent History Scope bilat knees ~ 5 years ago; scope Rt ankle ~ 11 year ago    Patient Stated Goals walk normally; exercise;  get back into the gym; get rid of pain    Currently in Pain? Yes   Pain Score 1    Pain  Location Knee   Pain Orientation Left;Lateral   Pain Descriptors / Indicators Aching   Pain Type Surgical pain                         [facility] Adult PT Treatment/Exercise - 04/06/16 0001      Knee/Hip Exercises: Stretches   ITB Stretch Left;3 reps;30 seconds   ITB Stretch Limitations supine with strap     Knee/Hip Exercises: Aerobic   Recumbent Bike L3 x 6 minutes     Knee/Hip Exercises: Machines for Strengthening   Cybex Knee Extension 35# L LE eccentric    Cybex Knee Flexion 45# B LE x 15 reps     Knee/Hip Exercises: Standing   Heel Raises 15 reps   Heel Raises Limitations L LE eccentric   Forward Lunges Right;Left;10 reps   Step Down Left;15 reps;Hand Hold: 2;Step Height: 6"   Step Down Limitations eccentric   Functional Squat 15 reps     Knee/Hip Exercises: Supine   Straight Leg Raises Strengthening;Left;1 set;15 reps   Straight Leg Raises Limitations 4#     Modalities   Modalities Vasopneumatic  Vasopneumatic   Number Minutes Vasopneumatic  15 minutes   Vasopnuematic Location  Knee   Vasopneumatic Pressure High   Vasopneumatic Temperature  lowest temp.       Manual Therapy   Manual Therapy Soft tissue mobilization   Soft tissue mobilization STM to L lateral/posterior knee - marshmallow stick to length of IT band                     PT Long Term Goals - 03/06/16 1459      PT LONG TERM GOAL #1   Title Improve gait pattern with patient to demonstrate normal gait pattern without assistive device 04/12/16   Time 6   Period Weeks   Status On-going     PT LONG TERM GOAL #2   Title Increase Lt knee ROM 0 to 125 deg 04/12/16   Time 5   Period Weeks   Status On-going     PT LONG TERM GOAL #3   Title 5/5 strength Lt LE 04/12/16   Time 6   Period Weeks   Status On-going     PT LONG TERM GOAL #4   Title Independent in HEP 04/12/16   Time 6   Period Weeks   Status On-going     PT LONG TERM GOAL #5   Title Improve FOTO to </=  32% limitation 04/12/16   Time 6   Period Weeks   Status On-going               Plan - 04/06/16 1637    Clinical Impression Statement Hiedi today with complaints of continued "spasms" of L lateral knee. PT performing STM to length of IT band as well as area of insertion - with pain provocation and relief with use of roller stick. Education to begin performing IT band stretch with good carryover. Otherwise good progression of strengthening today.    PT Treatment/Interventions Patient/family education;ADLs/Self Care Home Management;Neuromuscular re-education;Cryotherapy;Electrical Stimulation;Iontophoresis 4mg /ml Dexamethasone;Moist Heat;Ultrasound;Dry needling;Manual techniques;Therapeutic activities;Therapeutic exercise   PT Next Visit Plan ROM; strengthening; gait training; stretching; funcitonal activities; manual work; modalities as indicated.    Consulted and Agree with Plan of Care Patient      Patient will benefit from skilled therapeutic intervention in order to improve the following deficits and impairments:  Postural dysfunction, Pain, Abnormal gait, Decreased range of motion, Decreased mobility, Decreased strength, Decreased activity tolerance, Decreased balance  Visit Diagnosis: Chronic pain of left knee  Weakness generalized  Other abnormalities of gait and mobility     Problem List Patient Active Problem List   Diagnosis Date Noted  . Seasonal allergies   . Hypothyroid      Lanney Gins, PT, DPT 04/06/16 4:43 PM   [facility] [address]  [place], [place], [address] Phone: [phone]   Fax:  [phone]  Name: Mandy Bond MRN: [id number] Date of Birth: September 08, 1965

## 2016-04-10 ENCOUNTER — Ambulatory Visit: Payer: BLUE CROSS/BLUE SHIELD | Admitting: Physical Therapy

## 2016-04-10 DIAGNOSIS — G8929 Other chronic pain: Secondary | ICD-10-CM

## 2016-04-10 DIAGNOSIS — R2689 Other abnormalities of gait and mobility: Secondary | ICD-10-CM

## 2016-04-10 DIAGNOSIS — M25562 Pain in left knee: Secondary | ICD-10-CM | POA: Diagnosis not present

## 2016-04-10 DIAGNOSIS — R531 Weakness: Secondary | ICD-10-CM | POA: Diagnosis not present

## 2016-04-10 NOTE — Therapy (Signed)
[facility] [address]  [place], [place], [address] Phone: [phone]   Fax:  [phone]  Physical Therapy Treatment  Patient Details  Name: Mandy Bond MRN: [id number] Date of Birth: 02-10-1965 Referring Provider: Dr. Nickola Major   Encounter Date: 04/10/2016      PT End of Session - 04/10/16 1533    Visit Number 11   Number of Visits 12   Date for PT Re-Evaluation 04/12/16   PT Start Time 9924   PT Stop Time 1626   PT Time Calculation (min) 56 min   Activity Tolerance Patient tolerated treatment well   Behavior During Therapy [facility] for tasks assessed/performed      Past Medical History:  Diagnosis Date  . AGUS favor benign 2007   w/u suggested endometriosis of cervix  . Allergy   . Arthritis    knees  . 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 GERD (gastroesophageal reflux disease)   . Hypothyroid    pt denies- no medication for this   . Seasonal allergies   . Ulcer ([place]) 2005-2006   past hx long ago per pt.  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 ABDOMINAL HYSTERECTOMY  12/2007   TAH,BSO  . ANKLE SURGERY    . CHOLECYSTECTOMY    . DILATION AND CURETTAGE OF UTERUS    . HYSTEROSCOPY  12/2000, 08/2005   AND D&C  . KNEE SURGERY Bilateral   . NEURECTOMY FOOT    . OOPHORECTOMY     BSO  . PELVIC LAPAROSCOPY     DIAG LAP/STAGE 4 ENDOMETRIOSIS  . TONSILLECTOMY      There were no vitals filed for this visit.      Subjective Assessment - 04/10/16 1533    Subjective feeling better - no spasms since beginning IT band stretch   Pertinent History Scope bilat knees ~ 5 years ago; scope Rt ankle ~ 11 year ago    Patient Stated Goals walk normally; exercise;  get back into the gym; get rid of pain    Currently in Pain? Yes   Pain Score 2    Pain Location  Knee   Pain Orientation Left   Pain Descriptors / Indicators Aching;Tightness   Pain Type Surgical pain   Pain Onset More than a month ago   Pain Frequency Intermittent                         [facility] Adult PT Treatment/Exercise - 04/10/16 1534      Knee/Hip Exercises: Aerobic   Recumbent Bike L2 x 6 minutes     Knee/Hip Exercises: Machines for Strengthening   Cybex Knee Extension 35# L LE eccentric 2 x 15   Cybex Knee Flexion 45# B LE 2 x 15     Knee/Hip Exercises: Standing   Forward Lunges Right;Left;10 reps   Forward Lunges Limitations 1 UE support   Side Lunges Limitations side stepping 30 feet each way - red tband at ankles   Lateral Step Up Left;15 reps;Hand Hold: 0;Step Height: 8"   Lateral Step Up Limitations 5# at ankle   Forward Step Up Left;15 reps;Hand Hold: 0;Step Height: 8"   Forward Step Up Limitations 5# at ankle   Functional Squat 15 reps     Knee/Hip Exercises:  Seated   Long Arc Quad Strengthening;Left;1 set;15 reps;Weights   Long Arc Quad Weight 5 lbs.   Long [employer] Limitations with ball squeeze   Other Seated Knee/Hip Exercises Fitter - 2 black x 15 reps     Knee/Hip Exercises: Supine   Bridges Limitations 15 reps   Straight Leg Raises Strengthening;Left;1 set;15 reps   Straight Leg Raises Limitations 5#     Knee/Hip Exercises: Sidelying   Hip ABduction Strengthening;Left;15 reps   Hip ABduction Limitations 2#     Modalities   Modalities Vasopneumatic     Vasopneumatic   Number Minutes Vasopneumatic  15 minutes   Vasopnuematic Location  Knee   Vasopneumatic Pressure High   Vasopneumatic Temperature  lowest temp.                       PT Long Term Goals - 03/06/16 1459      PT LONG TERM GOAL #1   Title Improve gait pattern with patient to demonstrate normal gait pattern without assistive device 04/12/16   Time 6   Period Weeks   Status On-going     PT LONG TERM GOAL #2   Title Increase Lt knee ROM 0 to 125  deg 04/12/16   Time 5   Period Weeks   Status On-going     PT LONG TERM GOAL #3   Title 5/5 strength Lt LE 04/12/16   Time 6   Period Weeks   Status On-going     PT LONG TERM GOAL #4   Title Independent in HEP 04/12/16   Time 6   Period Weeks   Status On-going     PT LONG TERM GOAL #5   Title Improve FOTO to </= 32% limitation 04/12/16   Time 6   Period Weeks   Status On-going               Plan - 04/10/16 1534    Clinical Impression Statement Nakiah doing well today - no "spasms" reported since re-initiation of IT band stretching. Does continue to feel stiff from time to time at work, likely related to increased activity recently. Making excellent progress with all strengthening tasks. Will plan to review comprehensive HEP at next visit and plan for d/c vs 30 day hold as patient will be ready to transition to independent HEP practice with good compliance.    PT Treatment/Interventions Patient/family education;ADLs/Self Care Home Management;Neuromuscular re-education;Cryotherapy;Electrical Stimulation;Iontophoresis 4mg /ml Dexamethasone;Moist Heat;Ultrasound;Dry needling;Manual techniques;Therapeutic activities;Therapeutic exercise   PT Next Visit Plan ROM; strengthening; gait training; stretching; funcitonal activities; manual work; modalities as indicated.    Consulted and Agree with Plan of Care Patient      Patient will benefit from skilled therapeutic intervention in order to improve the following deficits and impairments:  Postural dysfunction, Pain, Abnormal gait, Decreased range of motion, Decreased mobility, Decreased strength, Decreased activity tolerance, Decreased balance  Visit Diagnosis: Chronic pain of left knee  Weakness generalized  Other abnormalities of gait and mobility     Problem List Patient Active Problem List   Diagnosis Date Noted  . Seasonal allergies   . Hypothyroid      Lanney Gins, PT, DPT 04/10/16 6:03 PM   [facility] [address]  [place], [place], [address] Phone: [phone]   Fax:  [phone]  Name: Mandy Bond MRN: [id number] Date of Birth: May 07, 1965

## 2016-04-13 ENCOUNTER — Ambulatory Visit: Payer: BLUE CROSS/BLUE SHIELD | Admitting: Physical Therapy

## 2016-04-13 DIAGNOSIS — M25562 Pain in left knee: Principal | ICD-10-CM

## 2016-04-13 DIAGNOSIS — R2689 Other abnormalities of gait and mobility: Secondary | ICD-10-CM | POA: Diagnosis not present

## 2016-04-13 DIAGNOSIS — R531 Weakness: Secondary | ICD-10-CM | POA: Diagnosis not present

## 2016-04-13 DIAGNOSIS — G8929 Other chronic pain: Secondary | ICD-10-CM

## 2016-04-13 NOTE — Therapy (Addendum)
[facility] [address]  [place], [place], [address] Phone: [phone]   Fax:  [phone]  Physical Therapy Treatment  Patient Details  Name: Mandy Bond MRN: [id number] Date of Birth: [birth year] Referring Provider: Dr. Nickola Major   Encounter Date: 04/13/2016      PT End of Session - 04/13/16 1750    Visit Number 12   Number of Visits 12   Date for PT Re-Evaluation 04/12/16   PT Start Time 7591   PT Stop Time 1631   PT Time Calculation (min) 56 min   Activity Tolerance Patient tolerated treatment well   Behavior During Therapy [facility] for tasks assessed/performed      Past Medical History:  Diagnosis Date  . AGUS favor benign 2007   w/u suggested endometriosis of cervix  . Allergy   . Arthritis    knees  . 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 GERD (gastroesophageal reflux disease)   . Hypothyroid    pt denies- no medication for this   . Seasonal allergies   . Ulcer ([place]) 2005-2006   past hx long ago per pt.  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 ABDOMINAL HYSTERECTOMY  12/2007   TAH,BSO  . ANKLE SURGERY    . CHOLECYSTECTOMY    . DILATION AND CURETTAGE OF UTERUS    . HYSTEROSCOPY  12/2000, 08/2005   AND D&C  . KNEE SURGERY Bilateral   . NEURECTOMY FOOT    . OOPHORECTOMY     BSO  . PELVIC LAPAROSCOPY     DIAG LAP/STAGE 4 ENDOMETRIOSIS  . TONSILLECTOMY      There were no vitals filed for this visit.      Subjective Assessment - 04/13/16 1638    Subjective feels well - some increased swelling today. Has been going up/down steps at work   Pertinent History Scope bilat knees ~ 5 years ago; scope Rt ankle ~ 11 year ago    Patient Stated Goals walk normally; exercise;  get back into the gym; get rid of pain    Currently in Pain? No/denies   Pain  Score 0-No pain            [facility] PT Assessment - 04/13/16 0001      AROM   Left Knee Extension -2   Left Knee Flexion 123     Strength   Right/Left Hip Right;Left   Right Hip Flexion 5/5   Right Hip Extension 5/5   Right Hip ABduction 5/5   Right Hip ADduction 5/5   Left Hip Flexion 5/5   Left Hip Extension 5/5   Left Hip ABduction 5/5   Left Hip ADduction 5/5   Right/Left Knee Right;Left   Right Knee Flexion 5/5   Right Knee Extension 5/5   Left Knee Flexion 5/5   Left Knee Extension 5/5                     [facility] Adult PT Treatment/Exercise - 04/13/16 1539      Knee/Hip Exercises: Aerobic   Nustep L6 x 6 minutes     Knee/Hip Exercises: Machines for Strengthening   Cybex Knee Extension 35# L LE eccentric 2 x 15   Cybex Knee Flexion 55# B LE 2 x 15     Knee/Hip  Exercises: Standing   Step Down Left;15 reps;Hand Hold: 2;Step Height: 8"   Step Down Limitations eccentric     Knee/Hip Exercises: Seated   Long Arc Quad Strengthening;Left;1 set;15 reps;Weights   Long Arc Quad Weight 5 lbs.   Long [employer] Limitations with ball squeeze     Knee/Hip Exercises: Supine   Straight Leg Raises Strengthening;Left;1 set;15 reps   Straight Leg Raises Limitations 5#     Knee/Hip Exercises: Sidelying   Hip ABduction Strengthening;Left;15 reps   Hip ABduction Limitations 5#     Modalities   Modalities Vasopneumatic     Vasopneumatic   Number Minutes Vasopneumatic  15 minutes   Vasopnuematic Location  Knee   Vasopneumatic Pressure High   Vasopneumatic Temperature  lowest temp.       Manual Therapy   Manual Therapy Soft tissue mobilization   Soft tissue mobilization STM to L lateral/posterior knee - marshmallow stick to length of IT band                     PT Long Term Goals - 04/13/16 1537      PT LONG TERM GOAL #1   Title Improve gait pattern with patient to demonstrate normal gait pattern without assistive device 04/12/16   Status  Achieved     PT LONG TERM GOAL #2   Title Increase Lt knee ROM 0 to 125 deg 04/12/16   Status Achieved     PT LONG TERM GOAL #3   Title 5/5 strength Lt LE 04/12/16   Status Achieved     PT LONG TERM GOAL #4   Title Independent in HEP 04/12/16   Status Achieved     PT LONG TERM GOAL #5   Title Improve FOTO to </= 32% limitation 04/12/16   Status Not Met               Plan - 04/13/16 1538    Clinical Impression Statement Mandy Bond has done very well with PT s/p L TKA. Patient with excellent strength and ROM improvements as well as with gait and general functional mobility. Patient able to ascend/descend stairs, ambulate without SPC, as well as return to normal household tasks without functional limitations. Patient with excellent compliance with HEP with patient stating she will be returning to the gym to continue to progress. Patient placed on 30 day hold today to allow patient to return to normal exercise regimen with understanding to call for any needs.    PT Treatment/Interventions Patient/family education;ADLs/Self Care Home Management;Neuromuscular re-education;Cryotherapy;Electrical Stimulation;Iontophoresis 53m/ml Dexamethasone;Moist Heat;Ultrasound;Dry needling;Manual techniques;Therapeutic activities;Therapeutic exercise   PT Next Visit Plan ROM; strengthening; gait training; stretching; funcitonal activities; manual work; modalities as indicated.    Consulted and Agree with Plan of Care Patient      Patient will benefit from skilled therapeutic intervention in order to improve the following deficits and impairments:  Postural dysfunction, Pain, Abnormal gait, Decreased range of motion, Decreased mobility, Decreased strength, Decreased activity tolerance, Decreased balance  Visit Diagnosis: Chronic pain of left knee  Weakness generalized  Other abnormalities of gait and mobility     Problem List Patient Active Problem List   Diagnosis Date Noted  . Seasonal allergies    . Hypothyroid     SLanney Gins PT, DPT 04/13/16 5:54 PM   PHYSICAL THERAPY DISCHARGE SUMMARY  Visits from Start of Care: 12  Current functional level related to goals / functional outcomes: See above   Remaining deficits: See above  Education / Equipment: HEP  Plan: Patient agrees to discharge.  Patient goals were met. Patient is being discharged due to meeting the stated rehab goals.  ?????     Lanney Gins, PT, DPT 05/16/16 8:30 AM    [facility] [address]  [place], [place], [address] Phone: [phone]   Fax:  [phone]  Name: Mandy Bond MRN: [id number] Date of Birth: Jun 12, 1965

## 2016-04-25 ENCOUNTER — Encounter (HOSPITAL_BASED_OUTPATIENT_CLINIC_OR_DEPARTMENT_OTHER): Payer: Self-pay | Admitting: *Deleted

## 2016-04-25 ENCOUNTER — Emergency Department (HOSPITAL_BASED_OUTPATIENT_CLINIC_OR_DEPARTMENT_OTHER): Payer: BLUE CROSS/BLUE SHIELD

## 2016-04-25 ENCOUNTER — Emergency Department (HOSPITAL_BASED_OUTPATIENT_CLINIC_OR_DEPARTMENT_OTHER)
Admission: EM | Admit: 2016-04-25 | Discharge: 2016-04-25 | Disposition: A | Discharge status: Home / Self Care | Payer: BLUE CROSS/BLUE SHIELD | Attending: Emergency Medicine | Admitting: Emergency Medicine

## 2016-04-25 DIAGNOSIS — K529 Noninfective gastroenteritis and colitis, unspecified: Secondary | ICD-10-CM | POA: Diagnosis not present

## 2016-04-25 DIAGNOSIS — E039 Hypothyroidism, unspecified: Secondary | ICD-10-CM | POA: Diagnosis not present

## 2016-04-25 DIAGNOSIS — K602 Anal fissure, unspecified: Secondary | ICD-10-CM | POA: Diagnosis not present

## 2016-04-25 DIAGNOSIS — R197 Diarrhea, unspecified: Secondary | ICD-10-CM | POA: Diagnosis not present

## 2016-04-25 LAB — CBC WITH DIFFERENTIAL/PLATELET
BASOS ABS: 0 10*3/uL (ref 0.0–0.1)
Basophils Relative: 0 %
EOS PCT: 1 %
Eosinophils Absolute: 0.1 10*3/uL (ref 0.0–0.7)
HCT: 35 % — ABNORMAL LOW (ref 36.0–46.0)
Hemoglobin: 11.8 g/dL — ABNORMAL LOW (ref 12.0–15.0)
LYMPHS PCT: 12 %
Lymphs Abs: 1.4 10*3/uL (ref 0.7–4.0)
MCH: 28.4 pg (ref 26.0–34.0)
MCHC: 33.7 g/dL (ref 30.0–36.0)
MCV: 84.3 fL (ref 78.0–100.0)
Monocytes Absolute: 0.8 10*3/uL (ref 0.1–1.0)
Monocytes Relative: 7 %
NEUTROS ABS: 9.7 10*3/uL — AB (ref 1.7–7.7)
Neutrophils Relative %: 80 %
PLATELETS: 251 10*3/uL (ref 150–400)
RBC: 4.15 MIL/uL (ref 3.87–5.11)
RDW: 14 % (ref 11.5–15.5)
WBC: 12.1 10*3/uL — AB (ref 4.0–10.5)

## 2016-04-25 LAB — BASIC METABOLIC PANEL
ANION GAP: 7 (ref 5–15)
BUN: 14 mg/dL (ref 6–20)
CO2: 24 mmol/L (ref 22–32)
Calcium: 9.7 mg/dL (ref 8.9–10.3)
Chloride: 107 mmol/L (ref 101–111)
Creatinine, Ser: 0.85 mg/dL (ref 0.44–1.00)
GFR calc Af Amer: >60 mL/min (ref >60)
GLUCOSE: 143 mg/dL — AB (ref 65–99)
POTASSIUM: 3.7 mmol/L (ref 3.5–5.1)
Sodium: 138 mmol/L (ref 135–145)

## 2016-04-25 MED ORDER — IOPAMIDOL (ISOVUE-300) INJECTION 61%
100.0000 mL | Freq: Once | INTRAVENOUS | Status: AC | PRN
  Administered 2016-04-25: 100 mL via INTRAVENOUS

## 2016-04-25 MED ORDER — METRONIDAZOLE 500 MG PO TABS
500.0000 mg | ORAL_TABLET | Freq: Once | ORAL | Status: AC
  Administered 2016-04-25: 500 mg via ORAL
  Filled 2016-04-25: qty 1

## 2016-04-25 MED ORDER — SODIUM CHLORIDE 0.9 % IV BOLUS (SEPSIS)
500.0000 mL | Freq: Once | INTRAVENOUS | Status: AC
  Administered 2016-04-25: 500 mL via INTRAVENOUS

## 2016-04-25 MED ORDER — KETOROLAC TROMETHAMINE 30 MG/ML IJ SOLN
30.0000 mg | Freq: Once | INTRAMUSCULAR | Status: AC
  Administered 2016-04-25: 30 mg via INTRAVENOUS
  Filled 2016-04-25: qty 1

## 2016-04-25 MED ORDER — CIPROFLOXACIN HCL 500 MG PO TABS
500.0000 mg | ORAL_TABLET | Freq: Once | ORAL | Status: AC
  Administered 2016-04-25: 500 mg via ORAL
  Filled 2016-04-25: qty 1

## 2016-04-25 MED ORDER — CIPROFLOXACIN HCL 500 MG PO TABS: 500.0000 mg | ORAL_TABLET | Freq: Two times a day (BID) | ORAL | 0 refills | Status: DC

## 2016-04-25 MED ORDER — DICYCLOMINE HCL 10 MG/ML IM SOLN
20.0000 mg | Freq: Once | INTRAMUSCULAR | Status: AC
Start: 2016-04-25 — End: 2016-04-25
  Administered 2016-04-25: 20 mg via INTRAMUSCULAR
  Filled 2016-04-25: qty 2

## 2016-04-25 MED ORDER — METRONIDAZOLE 500 MG PO TABS: 500.0000 mg | ORAL_TABLET | Freq: Three times a day (TID) | ORAL | 0 refills | Status: DC

## 2016-04-25 NOTE — ED Provider Notes (Signed)
[facility] DEPT [facility] Provider Note   CSN: [id number] Arrival date & time: 04/25/16  0344     History   Chief Complaint Chief Complaint  Patient presents with  . Diarrhea    HPI Mandy Bond is a 51 y.o. female.  The history is provided by the patient. No language interpreter was used.  Diarrhea   This is a new problem. The current episode started 3 to 5 hours ago. The problem occurs 5 to 10 times per day. The problem has not changed since onset.The stool consistency is described as watery. There has been no fever. Pertinent negatives include no vomiting, no chills, no sweats, no headaches, no arthralgias, no myalgias, no URI and no cough. She has tried nothing for the symptoms. The treatment provided no relief. Risk factors: works around [employer]. Her past medical history does not include irritable bowel syndrome.    Past Medical History:  Diagnosis Date  . AGUS favor benign 2007   w/u suggested endometriosis of cervix  . Allergy   . Arthritis    knees  . 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 GERD (gastroesophageal reflux disease)   . Hypothyroid    pt denies- no medication for this   . Seasonal allergies   . Ulcer 2005-2006   past hx long ago per pt.  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 Seasonal allergies   . Hypothyroid     Past Surgical History:  Procedure Laterality Date  . ABDOMINAL HYSTERECTOMY  12/2007   TAH,BSO  . ANKLE SURGERY    . CHOLECYSTECTOMY    . DILATION AND CURETTAGE OF UTERUS    . HYSTEROSCOPY  12/2000, 08/2005   AND D&C  . KNEE SURGERY Bilateral   . NEURECTOMY FOOT    . OOPHORECTOMY     BSO  . PELVIC LAPAROSCOPY     DIAG LAP/STAGE 4 ENDOMETRIOSIS  . TONSILLECTOMY      OB History    Gravida Para Term Preterm AB Living   0             SAB TAB Ectopic Multiple Live Births          Home Medications    Prior to Admission medications   Medication Sig Start Date End Date Taking? Authorizing Provider  traMADol (ULTRAM) 50 MG tablet Take by mouth every 6 (six) hours as needed.   Yes Historical Provider, MD  estradiol (VIVELLE-DOT) 0.075 MG/24HR APPLY ONE PATCH ONTO THE SKIN TWICE WEEKLY 12/03/15   Anastasio Auerbach, MD  Omeprazole (PRILOSEC PO) Take by mouth.      Historical Provider, MD    Family History Family History  Problem Relation Age of Onset  . Breast cancer Mother 58  . Diabetes Mother   . Cancer Father     Lung cancer  . Diabetes Sister   . Hashimoto's thyroiditis Sister   . Breast cancer Maternal Aunt     50's  . Colon cancer Neg Hx   . Colon polyps Neg Hx   . Esophageal cancer Neg Hx   . Rectal cancer Neg Hx   . Stomach cancer Neg Hx     Social History Social History  Substance Use Topics  . Smoking status: Never Smoker  . Smokeless tobacco: Never Used  . Alcohol use 0.5 oz/week  1 Standard drinks or equivalent per week     Comment: occassional     Allergies   Macrobid [nitrofurantoin macrocrystal]; Neomycin; and Vicodin [hydrocodone-acetaminophen]   Review of Systems Review of Systems  Constitutional: Negative for chills and fever.  HENT: Negative for ear pain.   Respiratory: Negative for cough.   Gastrointestinal: Positive for diarrhea and nausea. Negative for vomiting.  Genitourinary: Negative for dysuria.  Musculoskeletal: Negative for arthralgias and myalgias.  Neurological: Negative for headaches.  All other systems reviewed and are negative.    Physical Exam Updated Vital Signs BP (!) 127/109 (BP Location: Left Arm)   Pulse (!) 114   Temp 99.2 F (37.3 C) (Oral)   Resp 16   Ht 5\' 2"  (1.575 m)   Wt 230 lb (104.3 kg)   SpO2 98%   BMI 42.07 kg/m   Physical Exam  Constitutional: She is oriented to person, place, and time. She appears well-developed and well-nourished. No distress.  HENT:  Head:  Normocephalic and atraumatic.  Nose: Nose normal.  Mouth/Throat: No oropharyngeal exudate.  Eyes: Conjunctivae and EOM are normal. Pupils are equal, round, and reactive to light.  Neck: Normal range of motion. Neck supple.  Cardiovascular: Normal rate, regular rhythm, normal heart sounds and intact distal pulses.   Pulmonary/Chest: Effort normal and breath sounds normal. She has no wheezes. She has no rales.  Abdominal: Soft. She exhibits no mass. There is no tenderness. There is no rebound and no guarding.  Genitourinary:  Genitourinary Comments: Anal fissure chaperone present  Musculoskeletal: Normal range of motion.  Neurological: She is alert and oriented to person, place, and time. She displays normal reflexes.  Skin: Skin is warm and dry. Capillary refill takes less than 2 seconds.  Psychiatric: She has a normal mood and affect.     ED Treatments / Results  Labs (all labs ordered are listed, but only abnormal results are displayed)  Results for orders placed or performed during the hospital encounter of 04/25/16  CBC with Differential/Platelet  Result Value Ref Range   WBC 12.1 (H) 4.0 - 10.5 K/uL   RBC 4.15 3.87 - 5.11 MIL/uL   Hemoglobin 11.8 (L) 12.0 - 15.0 g/dL   HCT 35.0 (L) 36.0 - 46.0 %   MCV 84.3 78.0 - 100.0 fL   MCH 28.4 26.0 - 34.0 pg   MCHC 33.7 30.0 - 36.0 g/dL   RDW 14.0 11.5 - 15.5 %   Platelets 251 150 - 400 K/uL   Neutrophils Relative % 80 %   Neutro Abs 9.7 (H) 1.7 - 7.7 K/uL   Lymphocytes Relative 12 %   Lymphs Abs 1.4 0.7 - 4.0 K/uL   Monocytes Relative 7 %   Monocytes Absolute 0.8 0.1 - 1.0 K/uL   Eosinophils Relative 1 %   Eosinophils Absolute 0.1 0.0 - 0.7 K/uL   Basophils Relative 0 %   Basophils Absolute 0.0 0.0 - 0.1 K/uL  Basic metabolic panel  Result Value Ref Range   Sodium 138 135 - 145 mmol/L   Potassium 3.7 3.5 - 5.1 mmol/L   Chloride 107 101 - 111 mmol/L   CO2 24 22 - 32 mmol/L   Glucose, Bld 143 (H) 65 - 99 mg/dL   BUN 14 6 -  20 mg/dL   Creatinine, Ser 0.85 0.44 - 1.00 mg/dL   Calcium 9.7 8.9 - 10.3 mg/dL   GFR calc non Af Amer >60 >60 mL/min   GFR calc Af Amer >60 >60 mL/min  Anion gap 7 5 - 15   No results found.   Radiology No results found.  Procedures Procedures (including critical care time)  Medications Ordered in ED  Medications  ketorolac (TORADOL) 30 MG/ML injection 30 mg (not administered)  sodium chloride 0.9 % bolus 500 mL (500 mLs Intravenous New Bag/Given 04/25/16 0450)  dicyclomine (BENTYL) injection 20 mg (20 mg Intramuscular Given 04/25/16 0452)  iopamidol (ISOVUE-300) 61 % injection 100 mL (100 mLs Intravenous Contrast Given 04/25/16 0538)       Final Clinical Impressions(s) / ED Diagnoses  Diarrhea: anal fissure is actively bleeding and have advised babywipes. Patient reports "pieces coming out"  Though stool in the rectal vault is a normal color.  Well appearing. Given finding of colitis with without abdominal tenderness nor vomting is stable for discharge on oral antibiotics and will require follow up with GI once symptoms have resolved.  Return for intractable pain, fever, inability to pass gas, rigid abdomen, or vomiting or diarrhea, weakness, or any concerns. Patient is extremely well appearing and exam and imaging are negative for acute finding.   After history, exam, and medical workup I feel the patient has been appropriately medically screened and is safe for discharge home. Pertinent diagnoses were discussed with the patient. Patient was given return precautions. New Prescriptions New Prescriptions   No medications on file     Jerre Diguglielmo, MD 04/25/16 [phone]

## 2016-04-25 NOTE — ED Triage Notes (Signed)
Pt began having abd cramping and diarrhea yesterday PM around 1700 last two BMs had small amount of BRB

## 2016-04-25 NOTE — ED Notes (Signed)
Present with MD for rectal exam.

## 2016-04-27 ENCOUNTER — Encounter: Payer: Self-pay | Admitting: Nurse Practitioner

## 2016-04-27 ENCOUNTER — Ambulatory Visit (INDEPENDENT_AMBULATORY_CARE_PROVIDER_SITE_OTHER): Payer: BLUE CROSS/BLUE SHIELD | Admitting: Nurse Practitioner

## 2016-04-27 VITALS — BP 102/68 | HR 74 | Ht 62.5 in | Wt 231.0 lb

## 2016-04-27 DIAGNOSIS — K529 Noninfective gastroenteritis and colitis, unspecified: Secondary | ICD-10-CM

## 2016-04-27 NOTE — Progress Notes (Signed)
Reviewed and agree with documentation and assessment and plan. K. Veena Conlin Brahm , MD   

## 2016-04-27 NOTE — Progress Notes (Signed)
HPI: Patient is a 51 yo female known to Dr. Silverio Decamp. She has a history of adenomatous colon polyps. Patient referred by PCP, Dr. Shon Baton, for evaluation of diarrhea. Monday evening patient developed lower abdominal cramping. She had a solid bowel movement which was subsequently followed by several hours of diarrhea. She then had several episodes of rectal bleeding. No fevers at home.  Patient went to the emergency department where her chemistry profile was unremarkable. WBC 12.1, hemoglobin 11.8, CBC otherwise unremarkable. CT scan of the abdomen and pelvis showed mild wall thickening of the descending colon. Patient was given 7 days of Cipro and Flagyl. She is on day 2 of antibiotics. No bowel movements yesterday or today. She still has some mild cramping. She had knee surgery couple of months ago and did have antibiotics at the time. No stool studies were collected in the emergency department  Past Medical History:  Diagnosis Date  . AGUS favor benign 2007   w/u suggested endometriosis of cervix  . Allergy   . Arthritis    knees  . 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 GERD (gastroesophageal reflux disease)   . Hypothyroid    pt denies- no medication for this   . Seasonal allergies   . Ulcer 2005-2006   past hx long ago per pt.  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 ABDOMINAL HYSTERECTOMY  12/2007   TAH,BSO  . ANKLE SURGERY    . CHOLECYSTECTOMY    . DILATION AND CURETTAGE OF UTERUS    . HYSTEROSCOPY  12/2000, 08/2005   AND D&C  . KNEE SURGERY Bilateral   . NEURECTOMY FOOT    . OOPHORECTOMY     BSO  . PELVIC LAPAROSCOPY     DIAG LAP/STAGE 4 ENDOMETRIOSIS  . TONSILLECTOMY     Family History  Problem Relation Age of Onset  . Breast cancer Mother 76  . Diabetes Mother   . Cancer Father     Lung cancer  . Diabetes Sister    . Hashimoto's thyroiditis Sister   . Breast cancer Maternal Aunt     50's  . Colon cancer Neg Hx   . Colon polyps Neg Hx   . Esophageal cancer Neg Hx   . Rectal cancer Neg Hx   . Stomach cancer Neg Hx    Social History  Substance Use Topics  . Smoking status: Never Smoker  . Smokeless tobacco: Never Used  . Alcohol use 0.5 oz/week    1 Standard drinks or equivalent per week     Comment: occassional   Current Outpatient Prescriptions  Medication Sig Dispense Refill  . ciprofloxacin (CIPRO) 500 MG tablet Take 1 tablet (500 mg total) by mouth 2 (two) times daily. One po bid x 7 days 14 tablet 0  . estradiol (VIVELLE-DOT) 0.075 MG/24HR APPLY ONE PATCH ONTO THE SKIN TWICE WEEKLY 24 patch 4  . metroNIDAZOLE (FLAGYL) 500 MG tablet Take 1 tablet (500 mg total) by mouth 3 (three) times daily. One po tid x 7 days 21 tablet 0  . Omeprazole (PRILOSEC PO) Take by mouth.      . traMADol (ULTRAM) 50 MG tablet Take by mouth every 6 (six) hours as needed.     Current Facility-Administered Medications  Medication Dose Route Frequency Provider Last Rate Last Dose  .  0.9 %  sodium chloride infusion  500 mL Intravenous Continuous Mauri Pole, MD       Allergies  Allergen Reactions  . Macrobid [Nitrofurantoin Macrocrystal]   . Neomycin     TOPICAL  . Vicodin [Hydrocodone-Acetaminophen]     HEADACHE     Review of Systems: All systems reviewed and negative except where noted in HPI.    Physical Exam: BP 102/68   Pulse 74   Ht 5' 2.5" (1.588 m)   Wt 231 lb (104.8 kg)   BMI 41.58 kg/m  Constitutional:  Well-developed, white female in no acute distress. Psychiatric: Normal mood and affect. Behavior is normal. EENT:  Conjunctivae are normal. No scleral icterus. Neck supple.  Cardiovascular: Normal rate, regular rhythm.  Pulmonary/chest: Effort normal and breath sounds normal. No wheezing, rales or rhonchi. Abdominal: Soft, nondistended, mild diffuse lower abdominal tenderness.   Bowel sounds active throughout. There are no masses palpable.  Extremities: no edema Lymphadenopathy: No cervical adenopathy noted. Neurological: Alert and oriented to person place and time. Skin: Skin is warm and dry. No rashes noted.   ASSESSMENT AND PLAN:  51 -year-old female with acute segmental colitis, improving on cipro / flagyl. Suspect infectious etiology, possibly C. difficile in which case I would normally stop the Cipro. Stool studies were not obtained in the ED however and she is improving on Cipro / Flagyl so will finish the course.  -call me in 5-7 days with an update  If recurrent diarrhea, obtain stool studies. Offered Hyoscyamine for cramps, she prefers to hold off for now.  -Recommended low residue diet for the next several days.   Tye Savoy, NP  04/27/2016, 9:17 AM  Cc: Shon Baton, MD

## 2016-04-27 NOTE — Progress Notes (Signed)
Reviewed and agree with documentation and assessment and plan. K. Veena Yashas Camilli , MD   

## 2016-04-27 NOTE — Patient Instructions (Signed)
If you are age 53 or older, your body mass index should be between 23-30. Your Body mass index is 41.58 kg/m. If this is out of the aforementioned range listed, please consider follow up with your Primary Care Provider.  If you are age 58 or younger, your body mass index should be between 19-25. Your Body mass index is 41.58 kg/m. If this is out of the aformentioned range listed, please consider follow up with your Primary Care Provider.     Complete Cipro and Flagyl.  Call if symptoms recur.  Thank you for choosing me and [facility]. Tye Savoy, NP

## 2016-05-26 DIAGNOSIS — Z96659 Presence of unspecified artificial knee joint: Secondary | ICD-10-CM | POA: Diagnosis not present

## 2016-06-06 DIAGNOSIS — M79662 Pain in left lower leg: Secondary | ICD-10-CM | POA: Diagnosis not present

## 2016-11-30 DIAGNOSIS — Z1231 Encounter for screening mammogram for malignant neoplasm of breast: Secondary | ICD-10-CM | POA: Diagnosis not present

## 2016-11-30 DIAGNOSIS — Z803 Family history of malignant neoplasm of breast: Secondary | ICD-10-CM | POA: Diagnosis not present

## 2016-12-04 ENCOUNTER — Ambulatory Visit (INDEPENDENT_AMBULATORY_CARE_PROVIDER_SITE_OTHER): Payer: BLUE CROSS/BLUE SHIELD | Admitting: Gynecology

## 2016-12-04 ENCOUNTER — Encounter: Payer: Self-pay | Admitting: Gynecology

## 2016-12-04 VITALS — BP 126/80 | Ht 62.0 in | Wt 235.0 lb

## 2016-12-04 DIAGNOSIS — Z79890 Hormone replacement therapy (postmenopausal): Secondary | ICD-10-CM | POA: Diagnosis not present

## 2016-12-04 DIAGNOSIS — Z01419 Encounter for gynecological examination (general) (routine) without abnormal findings: Secondary | ICD-10-CM | POA: Diagnosis not present

## 2016-12-04 MED ORDER — ESTRADIOL 0.075 MG/24HR TD PTTW: MEDICATED_PATCH | TRANSDERMAL | 4 refills | Status: DC

## 2016-12-04 NOTE — Progress Notes (Signed)
    Mandy Bond [birth year] [id number]        51 y.o.  G0P0 for annual gynecologic exam.  Doing well without gynecologic complaints  Past medical history,surgical history, problem list, medications, allergies, family history and social history were all reviewed and documented as reviewed in the EPIC chart.  ROS:  Performed with pertinent positives and negatives included in the history, assessment and plan.   Additional significant findings : None   Exam: Caryn Bee assistant Vitals:   12/04/16 1448  BP: 126/80  Weight: 235 lb (106.6 kg)  Height: 5\' 2"  (1.575 m)   Body mass index is 42.98 kg/m.  General appearance:  Normal affect, orientation and appearance. Skin: Grossly normal HEENT: Without gross lesions.  No cervical or supraclavicular adenopathy. Thyroid normal.  Lungs:  Clear without wheezing, rales or rhonchi Cardiac: RR, without RMG Abdominal:  Soft, nontender, without masses, guarding, rebound, organomegaly or hernia Breasts:  Examined lying and sitting without masses, retractions, discharge or axillary adenopathy. Pelvic:  Ext, BUS, Vagina: Normal  Adnexa: Without masses or tenderness    Anus and perineum: Normal   Rectovaginal: Normal sphincter tone without palpated masses or tenderness.    Assessment/Plan:  51 y.o. G0P0 female for annual gynecologic exam.   1. Status post TVH/BSO for endometriosis.  On Vivelle 0.075 mg patches doing well.  I again reviewed the whole issue of HRT and when to wean.  Benefits to include symptom relief as well as cardiovascular and bone health versus risks to include thrombosis such as stroke heart attack DVT and breast cancer issue all discussed.  Patient wants to continue and I refilled her times 1 year. 2. Pap smear 11/2014.  History of VAIN 1 on cuff biopsies 2013 and 14.  Follow-up Pap smears 2014, 15, 16 all normal.  Plan repeat Pap smear next year at 3-year interval. 3. Mammography 11/2016.  Continue with annual mammography  when due.  SBE monthly reviewed. 4. Colonoscopy 2017.  Repeat at their recommended interval. 5. DEXA never.  Will plan further into the menopause. 6. Health maintenance.  No routine lab work done as patient does this elsewhere.  Follow-up 1 year, sooner as needed.   Anastasio Auerbach MD, 3:14 PM 12/04/2016

## 2016-12-04 NOTE — Patient Instructions (Signed)
Follow-up in one year.

## 2017-01-15 ENCOUNTER — Other Ambulatory Visit: Payer: Self-pay

## 2017-01-15 ENCOUNTER — Emergency Department (HOSPITAL_BASED_OUTPATIENT_CLINIC_OR_DEPARTMENT_OTHER): Payer: BLUE CROSS/BLUE SHIELD

## 2017-01-15 ENCOUNTER — Emergency Department (HOSPITAL_BASED_OUTPATIENT_CLINIC_OR_DEPARTMENT_OTHER)
Admission: EM | Admit: 2017-01-15 | Discharge: 2017-01-15 | Disposition: A | Discharge status: Home / Self Care | Payer: BLUE CROSS/BLUE SHIELD | Attending: Emergency Medicine | Admitting: Emergency Medicine

## 2017-01-15 ENCOUNTER — Encounter (HOSPITAL_BASED_OUTPATIENT_CLINIC_OR_DEPARTMENT_OTHER): Payer: Self-pay | Admitting: Emergency Medicine

## 2017-01-15 DIAGNOSIS — R5383 Other fatigue: Secondary | ICD-10-CM | POA: Diagnosis not present

## 2017-01-15 DIAGNOSIS — R0789 Other chest pain: Secondary | ICD-10-CM | POA: Diagnosis not present

## 2017-01-15 DIAGNOSIS — E039 Hypothyroidism, unspecified: Secondary | ICD-10-CM | POA: Insufficient documentation

## 2017-01-15 DIAGNOSIS — Z96651 Presence of right artificial knee joint: Secondary | ICD-10-CM | POA: Insufficient documentation

## 2017-01-15 DIAGNOSIS — R079 Chest pain, unspecified: Secondary | ICD-10-CM | POA: Diagnosis not present

## 2017-01-15 DIAGNOSIS — Z96652 Presence of left artificial knee joint: Secondary | ICD-10-CM | POA: Diagnosis not present

## 2017-01-15 LAB — COMPREHENSIVE METABOLIC PANEL
ALBUMIN: 4 g/dL (ref 3.5–5.0)
ALK PHOS: 85 U/L (ref 38–126)
ALT: 19 U/L (ref 14–54)
AST: 18 U/L (ref 15–41)
Anion gap: 9 (ref 5–15)
BILIRUBIN TOTAL: 0.8 mg/dL (ref 0.3–1.2)
BUN: 15 mg/dL (ref 6–20)
CALCIUM: 8.9 mg/dL (ref 8.9–10.3)
CO2: 23 mmol/L (ref 22–32)
CREATININE: 0.94 mg/dL (ref 0.44–1.00)
Chloride: 106 mmol/L (ref 101–111)
GFR calc Af Amer: >60 mL/min (ref >60)
GLUCOSE: 103 mg/dL — AB (ref 65–99)
Potassium: 3.7 mmol/L (ref 3.5–5.1)
Sodium: 138 mmol/L (ref 135–145)
TOTAL PROTEIN: 7.8 g/dL (ref 6.5–8.1)

## 2017-01-15 LAB — TROPONIN I
Troponin I: <0.03 ng/mL (ref <0.03)
Troponin I: <0.03 ng/mL (ref <0.03)

## 2017-01-15 LAB — CBC
HEMATOCRIT: 34.8 % — AB (ref 36.0–46.0)
Hemoglobin: 11.6 g/dL — ABNORMAL LOW (ref 12.0–15.0)
MCH: 29 pg (ref 26.0–34.0)
MCHC: 33.3 g/dL (ref 30.0–36.0)
MCV: 87 fL (ref 78.0–100.0)
Platelets: 258 10*3/uL (ref 150–400)
RBC: 4 MIL/uL (ref 3.87–5.11)
RDW: 14.4 % (ref 11.5–15.5)
WBC: 9 10*3/uL (ref 4.0–10.5)

## 2017-01-15 LAB — D-DIMER, QUANTITATIVE: D-Dimer, Quant: 0.9 ug/mL-FEU — ABNORMAL HIGH (ref 0.00–0.50)

## 2017-01-15 LAB — LIPASE, BLOOD: Lipase: 24 U/L (ref 11–51)

## 2017-01-15 MED ORDER — GI COCKTAIL ~~LOC~~
30.0000 mL | Freq: Once | ORAL | Status: AC
  Administered 2017-01-15: 30 mL via ORAL
  Filled 2017-01-15: qty 30

## 2017-01-15 MED ORDER — METHOCARBAMOL 1000 MG/10ML IJ SOLN
500.0000 mg | Freq: Once | INTRAMUSCULAR | Status: AC
  Administered 2017-01-15: 500 mg via INTRAVENOUS
  Filled 2017-01-15: qty 10

## 2017-01-15 MED ORDER — KETOROLAC TROMETHAMINE 15 MG/ML IJ SOLN
15.0000 mg | Freq: Once | INTRAMUSCULAR | Status: AC
  Administered 2017-01-15: 15 mg via INTRAVENOUS
  Filled 2017-01-15: qty 1

## 2017-01-15 MED ORDER — METHOCARBAMOL 500 MG PO TABS: 500.0000 mg | ORAL_TABLET | Freq: Three times a day (TID) | ORAL | 0 refills | Status: DC | PRN

## 2017-01-15 MED ORDER — IOPAMIDOL (ISOVUE-370) INJECTION 76%
100.0000 mL | Freq: Once | INTRAVENOUS | Status: AC | PRN
  Administered 2017-01-15: 100 mL via INTRAVENOUS

## 2017-01-15 MED ORDER — SODIUM CHLORIDE 0.9 % IV BOLUS (SEPSIS): 1000.0000 mL | Freq: Once | INTRAVENOUS | Status: DC

## 2017-01-15 MED ORDER — NAPROXEN 375 MG PO TABS: 375.0000 mg | ORAL_TABLET | Freq: Two times a day (BID) | ORAL | 0 refills | Status: AC

## 2017-01-15 MED FILL — METHOCARBAMOL 500 MG TABLET: 500 | 10 days supply | Qty: 30 | Fill #0

## 2017-01-15 MED FILL — NAPROXEN 375 MG TABS: 375 | 7 days supply | Qty: 14 | Fill #0

## 2017-01-15 NOTE — ED Notes (Signed)
NAD at this time. Pt is stable and going home.  

## 2017-01-15 NOTE — ED Provider Notes (Signed)
[facility] EMERGENCY DEPARTMENT Provider Note   CSN: [id number] Arrival date & time: 01/15/17  1028     History   Chief Complaint Chief Complaint  Patient presents with  . Chest Pain    HPI Mandy Bond is a 52 y.o. female.  HPI   52 yo F with PMHx GERD, hysterectomy on estrogen here with chest pain.  Patient reports she awoke this morning and started getting out of bed.  She then noticed that she had a sharp, positional right upper chest pain.  There is also a component of substernal pain.  The pain is worse with movement as well as palpation.  She denies any alleviating factors.  The pain does seem to worsen with deep inspiration.  Denies any cough.  She has a history of chest pain similar to this from her chest wall, but is here for getting everything "checked out."  She denies any reflux.  She is status post cholecystectomy.  Of note, she is on estrogen given her hysterectomy history.  Denies any history of blood clots.  No lower extremity swelling. No recent travel.  Past Medical History:  Diagnosis Date  . AGUS favor benign 2007   w/u suggested endometriosis of cervix  . Allergy   . Arthritis    knees  . 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 GERD (gastroesophageal reflux disease)   . Hypothyroid    pt denies- no medication for this   . Seasonal allergies   . Ulcer 2005-2006   past hx long ago per pt.  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 Seasonal allergies   . Hypothyroid     Past Surgical History:  Procedure Laterality Date  . ABDOMINAL HYSTERECTOMY  12/2007   TAH,BSO  . ANKLE SURGERY    . CHOLECYSTECTOMY    . DILATION AND CURETTAGE OF UTERUS    . HYSTEROSCOPY  12/2000, 08/2005   AND D&C  . KNEE SURGERY Bilateral   . NEURECTOMY FOOT    . OOPHORECTOMY     BSO  . PELVIC LAPAROSCOPY     DIAG  LAP/STAGE 4 ENDOMETRIOSIS  . REPLACEMENT TOTAL KNEE    . TONSILLECTOMY      OB History    Gravida Para Term Preterm AB Living   0             SAB TAB Ectopic Multiple Live Births                   Home Medications    Prior to Admission medications   Medication Sig Start Date End Date Taking? Authorizing Provider  estradiol (VIVELLE-DOT) 0.075 MG/24HR APPLY ONE PATCH ONTO THE SKIN TWICE WEEKLY 12/04/16   Fontaine, Belinda Block, MD  methocarbamol (ROBAXIN) 500 MG tablet Take 1 tablet (500 mg total) by mouth every 8 (eight) hours as needed for muscle spasms. 01/15/17   Duffy Bruce, MD  naproxen (NAPROSYN) 375 MG tablet Take 1 tablet (375 mg total) by mouth 2 (two) times daily with a meal for 7 days. 01/15/17 01/22/17  Duffy Bruce, MD  Omeprazole (PRILOSEC PO) Take by mouth.      [provider]    Family History Family History  Problem Relation Age of Onset  . Breast cancer Mother 44  . Diabetes Mother   . Cancer  Father        Lung cancer  . Diabetes Sister   . Hashimoto's thyroiditis Sister   . Breast cancer Maternal Aunt        50's  . Colon cancer Neg Hx   . Colon polyps Neg Hx   . Esophageal cancer Neg Hx   . Rectal cancer Neg Hx   . Stomach cancer Neg Hx     Social History Social History   Tobacco Use  . Smoking status: Never Smoker  . Smokeless tobacco: Never Used  Substance Use Topics  . Alcohol use: Yes    Alcohol/week: 0.5 oz    Types: 1 Standard drinks or equivalent per week    Comment: occassional  . Drug use: No     Allergies   Macrobid [nitrofurantoin macrocrystal]; Neomycin; and Vicodin [hydrocodone-acetaminophen]   Review of Systems Review of Systems  Constitutional: Positive for fatigue. Negative for chills and fever.  HENT: Negative for congestion, rhinorrhea and sore throat.   Eyes: Negative for visual disturbance.  Respiratory: Positive for chest tightness. Negative for cough, shortness of breath and wheezing.     Cardiovascular: Positive for chest pain. Negative for leg swelling.  Gastrointestinal: Negative for abdominal pain, diarrhea, nausea and vomiting.  Genitourinary: Negative for dysuria, flank pain, vaginal bleeding and vaginal discharge.  Musculoskeletal: Negative for neck pain and neck stiffness.  Skin: Negative for rash and wound.  Allergic/Immunologic: Negative for immunocompromised state.  Neurological: Negative for syncope, weakness and headaches.  Hematological: Does not bruise/bleed easily.  All other systems reviewed and are negative.    Physical Exam Updated Vital Signs BP 112/67   Pulse 74   Temp 98.5 F (36.9 C) (Oral)   Resp 19   Ht 5\' 2"  (1.575 m)   Wt 106.6 kg (235 lb)   SpO2 100%   BMI 42.98 kg/m   Physical Exam  Constitutional: She is oriented to person, place, and time. She appears well-developed and well-nourished. No distress.  HENT:  Head: Normocephalic and atraumatic.  Eyes: Conjunctivae are normal.  Neck: Neck supple.  Cardiovascular: Normal rate, regular rhythm and normal heart sounds. Exam reveals no friction rub.  No murmur heard. Pulmonary/Chest: Effort normal and breath sounds normal. No respiratory distress. She has no wheezes. She has no rales.  Significant pinpoint TTP over right upper chest wall/intercostal spaces.  Abdominal: She exhibits no distension.  Musculoskeletal: She exhibits no edema.  Neurological: She is alert and oriented to person, place, and time. She exhibits normal muscle tone.  Skin: Skin is warm. Capillary refill takes less than 2 seconds.  Psychiatric: She has a normal mood and affect.  Nursing note and vitals reviewed.    ED Treatments / Results  Labs (all labs ordered are listed, but only abnormal results are displayed) Labs Reviewed  CBC - Abnormal; Notable for the following components:      Result Value   Hemoglobin 11.6 (*)    HCT 34.8 (*)    All other components within normal limits  COMPREHENSIVE METABOLIC  PANEL - Abnormal; Notable for the following components:   Glucose, Bld 103 (*)    All other components within normal limits  D-DIMER, QUANTITATIVE (NOT AT [facility]) - Abnormal; Notable for the following components:   D-Dimer, Quant 0.90 (*)    All other components within normal limits  TROPONIN I  LIPASE, BLOOD  TROPONIN I    EKG  EKG Interpretation  Date/Time:  Monday January 15 2017 10:38:29 EST Ventricular Rate:  74 PR Interval:    QRS Duration: 126 QT Interval:  389 QTC Calculation: 432 R Axis:   60 Text Interpretation:  Sinus rhythm Atrial premature complex Right bundle branch block Since last EKG, RBBB has progressed Confirmed by Duffy Bruce [phone]) on 01/15/2017 10:55:41 AM       Radiology Dg Chest 2 View  Result Date: 01/15/2017 CLINICAL DATA:  52 year old female with right-sided chest pain worsening with certain motion. Nonsmoker. Initial encounter. EXAM: CHEST  2 VIEW COMPARISON:  01/08/2015 chest x-ray and chest CT. FINDINGS: Slightly poor inspiration. No infiltrate, congestive heart failure or pneumothorax. No plain film evidence of pulmonary malignancy. Heart size within normal limits. No acute osseous abnormality. IMPRESSION: No active cardiopulmonary disease. Electronically Signed   By: Genia Del M.D.   On: 01/15/2017 10:58   Ct Angio Chest Pe W And/or Wo Contrast  Result Date: 01/15/2017 CLINICAL DATA:  Right chest pain today. EXAM: CT ANGIOGRAPHY CHEST WITH CONTRAST TECHNIQUE: Multidetector CT imaging of the chest was performed using the standard protocol during bolus administration of intravenous contrast. Multiplanar CT image reconstructions and MIPs were obtained to evaluate the vascular anatomy. CONTRAST:  100 ml ISOVUE-370 IOPAMIDOL (ISOVUE-370) INJECTION 76% COMPARISON:  CT chest 01/08/2015 and CT abdomen and pelvis 04/25/2016. FINDINGS: Cardiovascular: No pulmonary embolus is identified. Heart size is normal. No pericardial effusion. No aneurysm. No  calcific atherosclerosis is identified. Mediastinum/Nodes: No enlarged mediastinal, hilar, or axillary lymph nodes. Thyroid gland, trachea, and esophagus demonstrate no significant findings. Lungs/Pleura: Lungs are clear. No pleural effusion or pneumothorax. Upper Abdomen: The patient is status post cholecystectomy. Cyst in left hepatic lobe measuring 8 cm in diameter is identified as seen on the prior exams. There is diffuse fatty infiltration of liver. Musculoskeletal: No acute abnormality or focal lesion. Review of the MIP images confirms the above findings. IMPRESSION: Negative for pulmonary embolus or acute cardiopulmonary disease. Fatty infiltration of the liver. Large cyst in the left hepatic lobe as seen on prior exams. Electronically Signed   By: Inge Rise M.D.   On: 01/15/2017 12:40    Procedures Procedures (including critical care time)  Medications Ordered in ED Medications  sodium chloride 0.9 % bolus 1,000 mL (1,000 mLs Intravenous Not Given 01/15/17 1244)  ketorolac (TORADOL) 15 MG/ML injection 15 mg (15 mg Intravenous Given 01/15/17 1118)  gi cocktail (Maalox,Lidocaine,Donnatal) (30 mLs Oral Given 01/15/17 1118)  iopamidol (ISOVUE-370) 76 % injection 100 mL (100 mLs Intravenous Contrast Given 01/15/17 1224)  methocarbamol (ROBAXIN) injection 500 mg (500 mg Intravenous Given 01/15/17 1501)     Initial Impression / Assessment and Plan / ED Course  I have reviewed the triage vital signs and the nursing notes.  Pertinent labs & imaging results that were available during my care of the patient were reviewed by me and considered in my medical decision making (see chart for details).     52 year old female with past medical history as above here with right upper chest pain.  She does have history of estrogen use.  I suspect her pain is secondary to musculoskeletal pain.  It is reproducible on exam and associated with movements.  Her EKG is nonischemic and she has negative troponins  x2 with heart score less than 3 and I do not suspect ACS.  D-dimer was elevated so CT angios obtained which is negative for PE or other acute abnormality.  She feels improved with analgesia in the ED.  Pain is not consistent with dissection and her pulses are symmetric.  Will discharge with supportive care and outpatient follow-up.  This note was prepared with assistance of Systems analyst. Occasional wrong-word or sound-a-like substitutions may have occurred due to the inherent limitations of voice recognition software.   Final Clinical Impressions(s) / ED Diagnoses   Final diagnoses:  Chest wall pain  Atypical chest pain    ED Discharge Orders        Ordered    methocarbamol (ROBAXIN) 500 MG tablet  Every 8 hours PRN     01/15/17 1459    naproxen (NAPROSYN) 375 MG tablet  2 times daily with meals     01/15/17 1459       Duffy Bruce, MD 01/15/17 1554

## 2017-01-15 NOTE — ED Triage Notes (Signed)
Patient states that she is having pain to her right chest  - reports that it worsens with certain movements  - Denies and SOB

## 2017-07-23 DIAGNOSIS — R946 Abnormal results of thyroid function studies: Secondary | ICD-10-CM | POA: Diagnosis not present

## 2017-07-23 DIAGNOSIS — Z0000 Encounter for general adult medical examination without abnormal findings: Secondary | ICD-10-CM | POA: Diagnosis not present

## 2017-07-23 DIAGNOSIS — R7309 Other abnormal glucose: Secondary | ICD-10-CM | POA: Diagnosis not present

## 2017-07-26 DIAGNOSIS — R946 Abnormal results of thyroid function studies: Secondary | ICD-10-CM | POA: Diagnosis not present

## 2017-07-26 DIAGNOSIS — Z23 Encounter for immunization: Secondary | ICD-10-CM | POA: Diagnosis not present

## 2017-07-26 DIAGNOSIS — Z0000 Encounter for general adult medical examination without abnormal findings: Secondary | ICD-10-CM | POA: Diagnosis not present

## 2017-07-26 DIAGNOSIS — N951 Menopausal and female climacteric states: Secondary | ICD-10-CM | POA: Diagnosis not present

## 2017-07-26 DIAGNOSIS — Z1389 Encounter for screening for other disorder: Secondary | ICD-10-CM | POA: Diagnosis not present

## 2017-07-26 DIAGNOSIS — R7309 Other abnormal glucose: Secondary | ICD-10-CM | POA: Diagnosis not present

## 2017-08-03 DIAGNOSIS — Z96659 Presence of unspecified artificial knee joint: Secondary | ICD-10-CM | POA: Diagnosis not present

## 2017-08-03 DIAGNOSIS — M25562 Pain in left knee: Secondary | ICD-10-CM | POA: Diagnosis not present

## 2017-08-08 DIAGNOSIS — Z6841 Body Mass Index (BMI) 40.0 and over, adult: Secondary | ICD-10-CM | POA: Diagnosis not present

## 2017-08-30 ENCOUNTER — Encounter: Payer: Self-pay | Admitting: Registered"

## 2017-08-30 ENCOUNTER — Encounter: Payer: BLUE CROSS/BLUE SHIELD | Attending: Internal Medicine | Admitting: Registered"

## 2017-08-30 DIAGNOSIS — Z713 Dietary counseling and surveillance: Secondary | ICD-10-CM | POA: Insufficient documentation

## 2017-08-30 DIAGNOSIS — E669 Obesity, unspecified: Secondary | ICD-10-CM

## 2017-08-30 NOTE — Progress Notes (Signed)
  Medical Nutrition Therapy:  Appt start time: 4:10 end time:  4:53.   Assessment:  Primary concerns today: Pt is interested in bariatric surgery and wanted to get information about it.   Pt expectation: bariatric surgery information  Pt states she stays away from cookout milkshakes. Pt states she prefers certan textures at times. Pt states she eats out on the the weekends. Pt states she stops  eating before 7pm and goes to bed about 9:30pm.   Pt reports diet history of Atkins, physician's weight loss, and weight watchers. Pt states she will mindless eat when watching tv. Pt states she prefers quick and easy meals.   Preferred Learning Style:   No preference indicated   Learning Readiness:   Ready  Change in progress   MEDICATIONS: See list   DIETARY INTAKE:  Usual eating pattern includes 3 meals and 0-2 snacks per day.  Everyday foods include eggs, fast food, almonds, chips, ice cream, .  Avoided foods include none stated.    24-hr recall:  B ( AM): 2 boiled eggs or biscuit Snk ( AM): none  L ( PM): almonds or tuna salad + celery or soup or fast food  Snk ( PM): sometimes ice cream or Sunchips D ( PM): shrimp and grits + spinach Snk ( PM): sometimes ice cream Beverages: water, coffee, unsweetened tea, carbonated water, alcohol (occasionally)  Usual physical activity: none  Estimated energy needs: 1600 calories 180 g carbohydrates 120 g protein 44 g fat  Progress Towards Goal(s):  In progress.   Nutritional Diagnosis:  NB-1.1 Food and nutrition-related knowledge deficit As related to lack of prior nutrition related education.  As evidenced by pt verablizes incomplete information for bariatric surgery.    Intervention:  Nutrition education and counseling. Pt was educated and counseled on bariatric surgery procedures and nutrition-related expectations associated with bariatric surgery. Pt was in agreement with goals.  Goals: - Contact Dr. Virgina Jock for referral to  [facility].  - Practice eating at least 3 meals a day.  - Practice eating away from work and tv.   Teaching Method Utilized:  Visual Auditory Hands on  Handouts given during visit include:  none  Barriers to learning/adherence to lifestyle change: work-life balance  Demonstrated degree of understanding via:  Teach Back   Monitoring/Evaluation:  Dietary intake, exercise, and body weight prn.

## 2017-08-30 NOTE — Patient Instructions (Addendum)
-   Contact Dr. Virgina Jock for referral to [facility].   - Practice eating at least 3 meals a day.   - Practice eating away from work and tv.

## 2017-12-05 IMAGING — DX DG CHEST 1V PORT
1 series · 1 of 1 positions shown · non-contrast
Comparison: None.

CLINICAL DATA: Chest pain and right shoulder blade pain.

EXAM:
PORTABLE CHEST 1 VIEW

[chest ap]
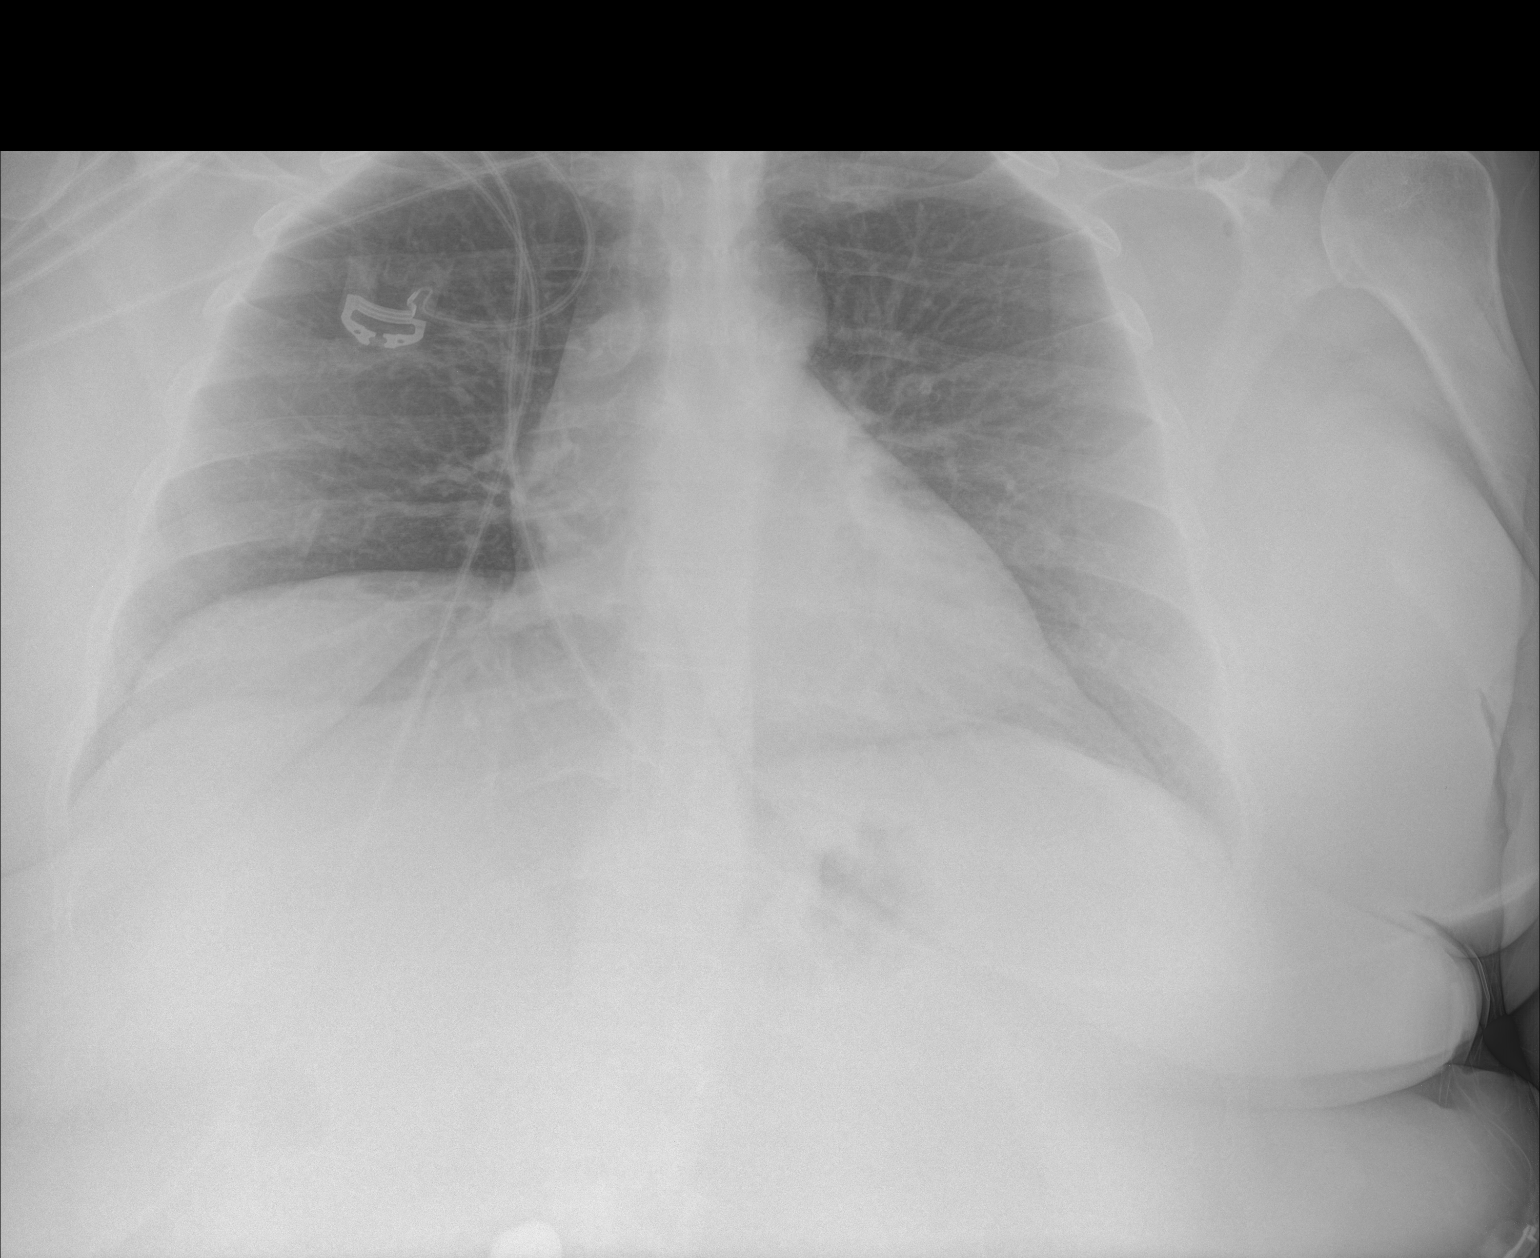

[1 of 1 positions shown; findings below may reference images not displayed]

FINDINGS: The heart size and mediastinal contours are within normal limits.
Both lungs are clear. The visualized skeletal structures are
unremarkable.
IMPRESSION: Normal chest.

## 2017-12-07 ENCOUNTER — Encounter: Payer: BLUE CROSS/BLUE SHIELD | Admitting: Gynecology

## 2018-01-21 ENCOUNTER — Encounter: Payer: Self-pay | Admitting: Gynecology

## 2018-01-21 ENCOUNTER — Ambulatory Visit (INDEPENDENT_AMBULATORY_CARE_PROVIDER_SITE_OTHER): Payer: BLUE CROSS/BLUE SHIELD | Admitting: Gynecology

## 2018-01-21 VITALS — BP 122/76 | Ht 62.0 in | Wt 239.0 lb

## 2018-01-21 DIAGNOSIS — Z01419 Encounter for gynecological examination (general) (routine) without abnormal findings: Secondary | ICD-10-CM | POA: Diagnosis not present

## 2018-01-21 DIAGNOSIS — Z79890 Hormone replacement therapy (postmenopausal): Secondary | ICD-10-CM

## 2018-01-21 MED ORDER — ESTRADIOL 0.075 MG/24HR TD PTTW: MEDICATED_PATCH | TRANSDERMAL | 4 refills | Status: DC

## 2018-01-21 NOTE — Progress Notes (Signed)
    Mandy Bond [birth year] [id number]        53 y.o.  G0P0 for annual gynecologic exam.  Doing well without complaints  Past medical history,surgical history, problem list, medications, allergies, family history and social history were all reviewed and documented as reviewed in the EPIC chart.  ROS:  Performed with pertinent positives and negatives included in the history, assessment and plan.   Additional significant findings : None   Exam: Caryn Bee assistant Vitals:   01/21/18 0801  BP: 122/76  Weight: 239 lb (108.4 kg)  Height: 5\' 2"  (1.575 m)   Body mass index is 43.71 kg/m.  General appearance:  Normal affect, orientation and appearance. Skin: Grossly normal HEENT: Without gross lesions.  No cervical or supraclavicular adenopathy. Thyroid normal.  Lungs:  Clear without wheezing, rales or rhonchi Cardiac: RR, without RMG Abdominal:  Soft, nontender, without masses, guarding, rebound, organomegaly or hernia Breasts:  Examined lying and sitting without masses, retractions, discharge or axillary adenopathy.  Several small classic appearing boils left breast Pelvic:  Ext, BUS, Vagina: Normal  Adnexa: Without masses or tenderness    Anus and perineum: Normal   Rectovaginal: Normal sphincter tone without palpated masses or tenderness.    Assessment/Plan:  53 y.o. G0P0 female for annual gynecologic exam status post TVH BSO for endometriosis..   1. HRT.  On Vivelle 0.075 mg patches doing well.  We have again discussed the risks versus benefits to include increased risk of thrombosis such as stroke heart attack DVT in the breast cancer issue.  Benefits to include cardiovascular and bone health as well as symptom relief also discussed.  At this point the patient strongly wants to continue and I refilled her x1 year. 2. Mammography 11/2016.  Due now and patient is in the process of arranging.  Breast exam normal today. 3. Colonoscopy 2017.  Repeat at their recommended  interval. 4. Pap smear 2016.  Pap smear done today.  History of VAIN 1 on vaginal cuff biopsies 2013/2014.  Normal Pap smears since. 5. DEXA never.  Will plan further into the menopause. 6. Health maintenance.  No routine lab work done as patient does this elsewhere.  Follow-up 1 year, sooner as needed.   Anastasio Auerbach MD, 8:28 AM 01/21/2018

## 2018-01-21 NOTE — Addendum Note (Signed)
Addended by: Nelva Nay on: 01/21/2018 08:47 AM   Modules accepted: Orders

## 2018-01-21 NOTE — Patient Instructions (Signed)
Follow-up in 1 year for annual exam, sooner if any issues. 

## 2018-01-22 LAB — PAP IG W/ RFLX HPV ASCU

## 2018-09-25 ENCOUNTER — Encounter: Payer: Self-pay | Admitting: Gynecology

## 2018-10-18 DIAGNOSIS — Z0000 Encounter for general adult medical examination without abnormal findings: Secondary | ICD-10-CM | POA: Diagnosis not present

## 2018-10-18 DIAGNOSIS — R946 Abnormal results of thyroid function studies: Secondary | ICD-10-CM | POA: Diagnosis not present

## 2018-10-18 DIAGNOSIS — Z23 Encounter for immunization: Secondary | ICD-10-CM | POA: Diagnosis not present

## 2018-10-18 DIAGNOSIS — R739 Hyperglycemia, unspecified: Secondary | ICD-10-CM | POA: Diagnosis not present

## 2018-10-25 DIAGNOSIS — Z1331 Encounter for screening for depression: Secondary | ICD-10-CM | POA: Diagnosis not present

## 2018-10-25 DIAGNOSIS — Z0000 Encounter for general adult medical examination without abnormal findings: Secondary | ICD-10-CM | POA: Diagnosis not present

## 2018-10-25 DIAGNOSIS — R739 Hyperglycemia, unspecified: Secondary | ICD-10-CM | POA: Diagnosis not present

## 2018-10-25 DIAGNOSIS — Q839 Congenital malformation of breast, unspecified: Secondary | ICD-10-CM | POA: Diagnosis not present

## 2018-10-25 DIAGNOSIS — R0981 Nasal congestion: Secondary | ICD-10-CM | POA: Diagnosis not present

## 2018-11-07 DIAGNOSIS — Z803 Family history of malignant neoplasm of breast: Secondary | ICD-10-CM | POA: Diagnosis not present

## 2018-11-07 DIAGNOSIS — N6325 Unspecified lump in the left breast, overlapping quadrants: Secondary | ICD-10-CM | POA: Diagnosis not present

## 2018-11-07 DIAGNOSIS — N6320 Unspecified lump in the left breast, unspecified quadrant: Secondary | ICD-10-CM | POA: Diagnosis not present

## 2018-11-08 ENCOUNTER — Encounter: Payer: Self-pay | Admitting: Gynecology

## 2019-01-29 ENCOUNTER — Telehealth: Payer: Self-pay | Admitting: *Deleted

## 2019-01-29 NOTE — Telephone Encounter (Signed)
Former Dr.Fontaine patient) patient called uses estradiol (vivelle-dot )patches twice weekly , rescheduled annual exam on 04/16/19. 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 She asked if you would be willing to send in refills for estradiol patches until annual exam in April? Please advise

## 2019-01-30 ENCOUNTER — Other Ambulatory Visit: Payer: Self-pay | Admitting: Obstetrics and Gynecology

## 2019-01-30 MED ORDER — ESTRADIOL 0.075 MG/24HR TD PTTW: MEDICATED_PATCH | TRANSDERMAL | 0 refills | Status: DC

## 2019-01-30 NOTE — Telephone Encounter (Signed)
Left detailed message on cell Rx has been sent to mail order pharmacy.

## 2019-01-30 NOTE — Telephone Encounter (Signed)
Yes, I gave her a refill that should last 3 months until her appt with me. Thanks.

## 2019-02-04 ENCOUNTER — Encounter: Payer: BLUE CROSS/BLUE SHIELD | Admitting: Obstetrics and Gynecology

## 2019-02-28 DIAGNOSIS — L918 Other hypertrophic disorders of the skin: Secondary | ICD-10-CM | POA: Diagnosis not present

## 2019-02-28 DIAGNOSIS — L732 Hidradenitis suppurativa: Secondary | ICD-10-CM | POA: Diagnosis not present

## 2019-03-23 IMAGING — CT CT ABD-PELV W/ CM
2 of 5 series · 16 of 46 positions shown, 18 images · IV contrast (APPLIED)
Comparison: Abdominal MRI dated 01/22/2015

CLINICAL DATA: 50-year-old female with abdominal cramping and
diarrhea.

EXAM:
CT ABDOMEN AND PELVIS WITH CONTRAST
TECHNIQUE: Multidetector CT imaging of the abdomen and pelvis was performed
using the standard protocol following bolus administration of
intravenous contrast.
CONTRAST:  100mL ANIO1F-011 IOPAMIDOL (ANIO1F-011) INJECTION 61%

[Series 2: axial st · axial · 0.98mm/px · z∈[-516,-42]mm · 13 of 107 slices shown, 15 images]
[im 6/107  soft-tissue]
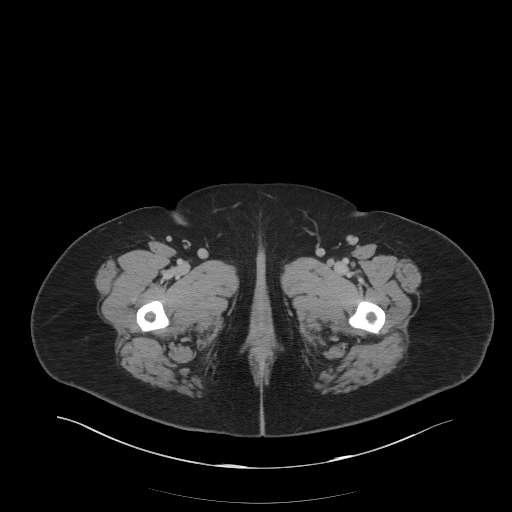
[im 6/107  bone]
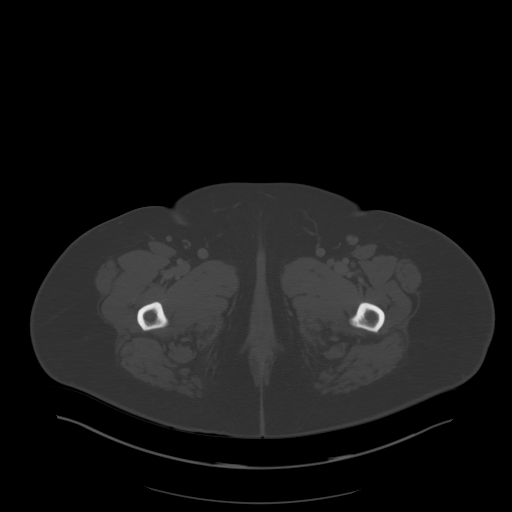
[im 16/107  soft-tissue]
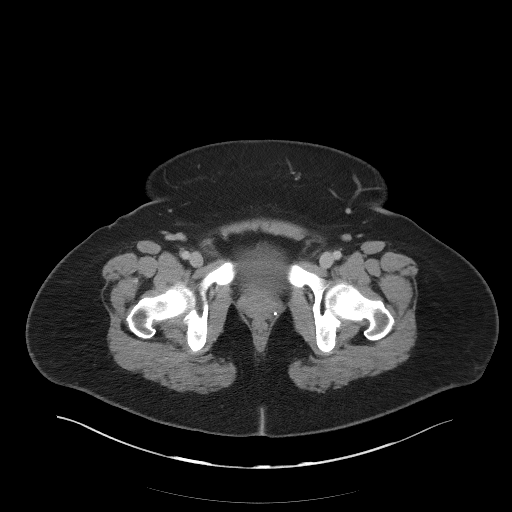
[im 22/107  soft-tissue]
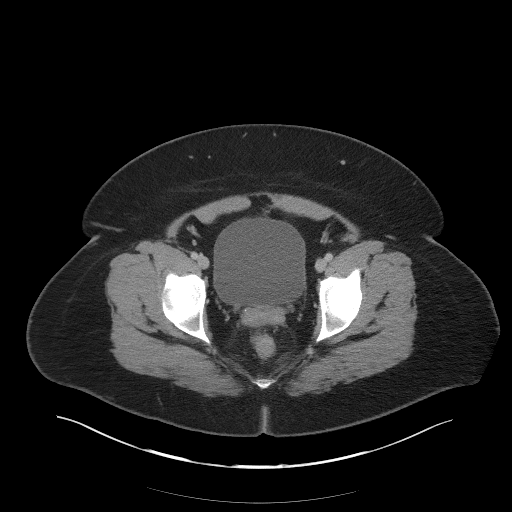
[im 32/107  soft-tissue]
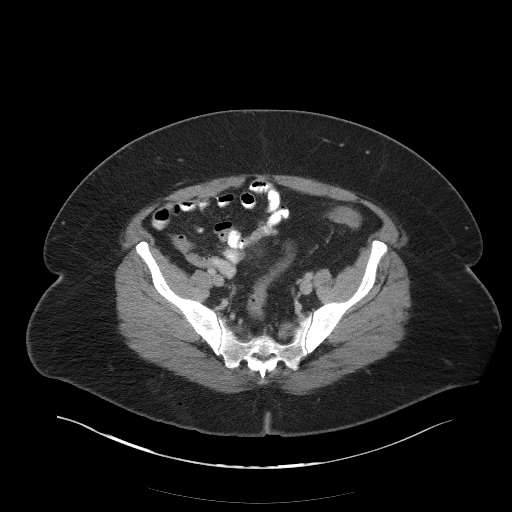
[im 38/107  soft-tissue]
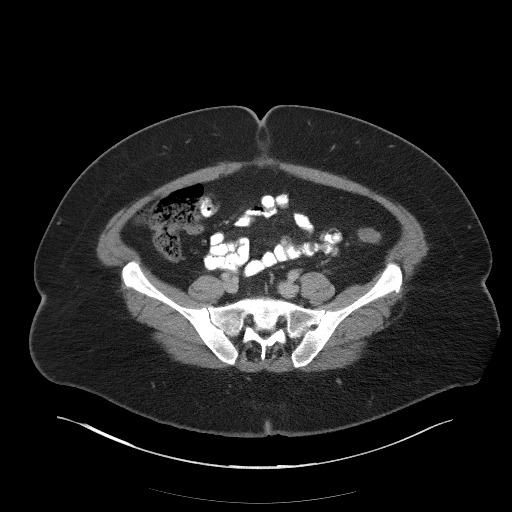
[im 48/107  soft-tissue]
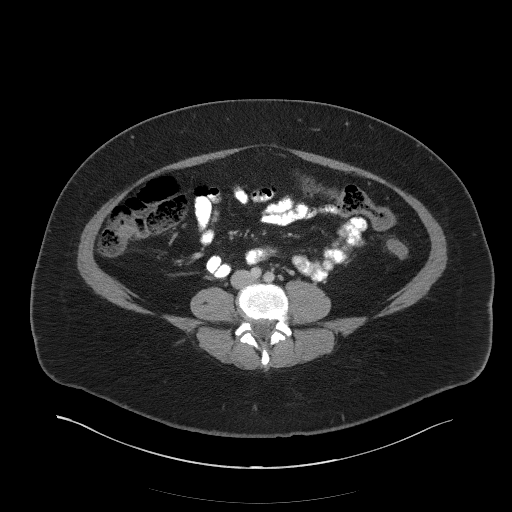
[im 54/107  soft-tissue]
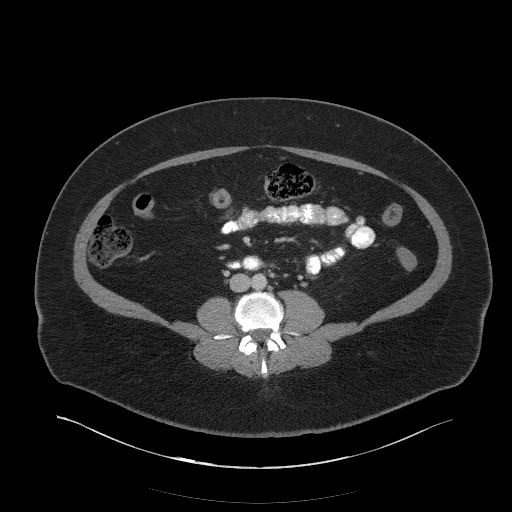
[im 59/107  soft-tissue]
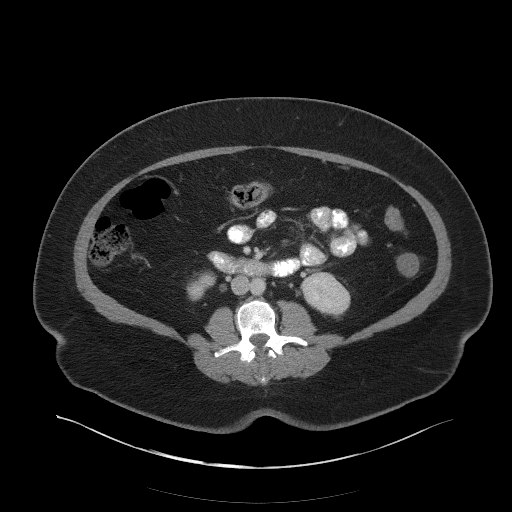
[im 69/107  soft-tissue]
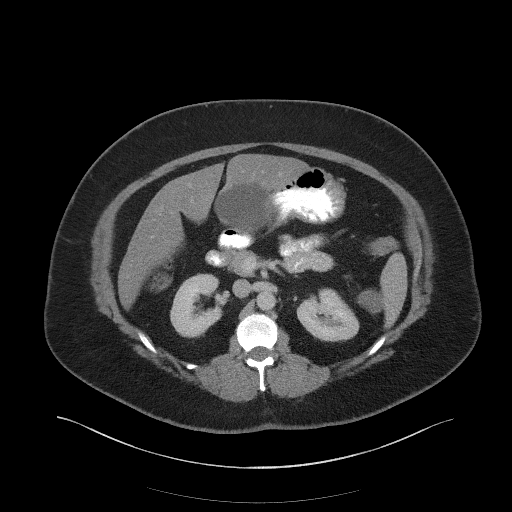
[im 69/107  bone]
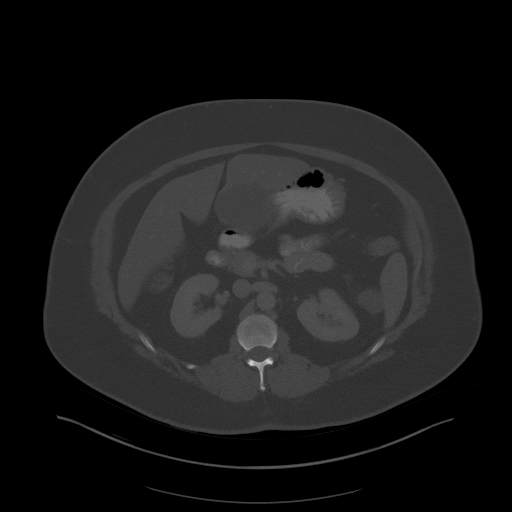
[im 75/107  soft-tissue]
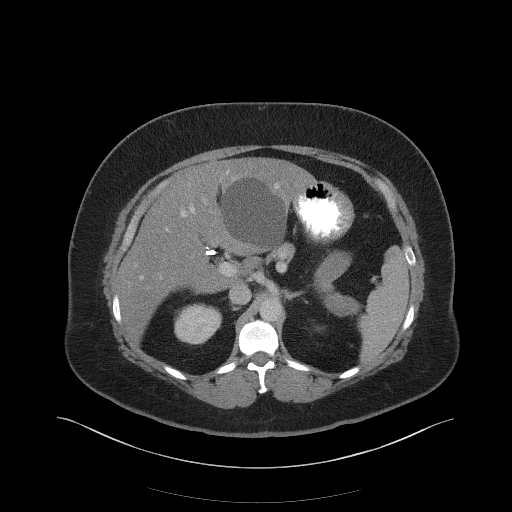
[im 85/107  soft-tissue]
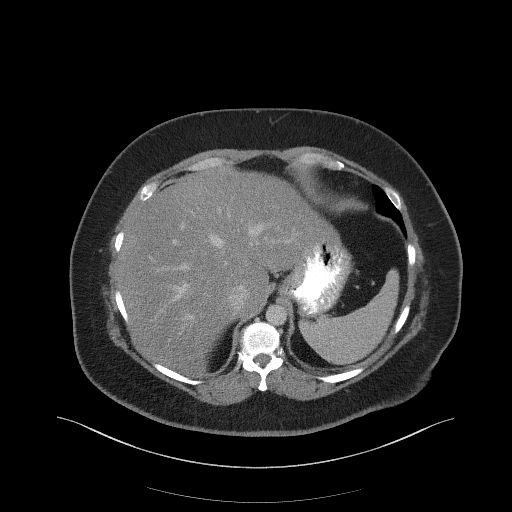
[im 91/107  soft-tissue]
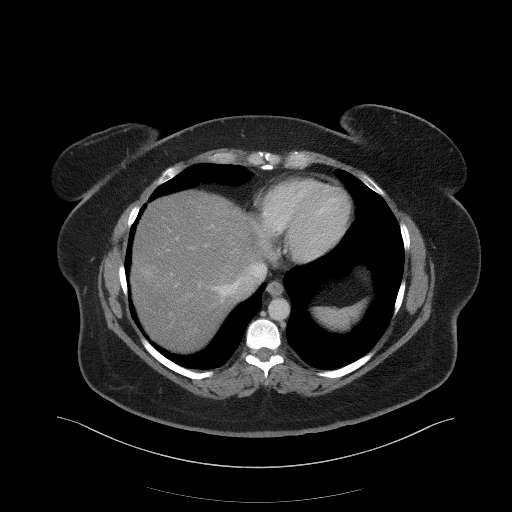
[im 101/107  soft-tissue]
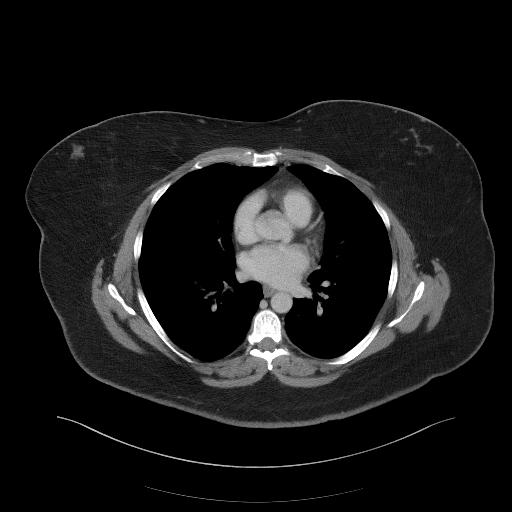

[Series 5: coronal st · coronal · 0.88mm/px · 3 of 86 slices shown]
[im 29/86  soft-tissue]
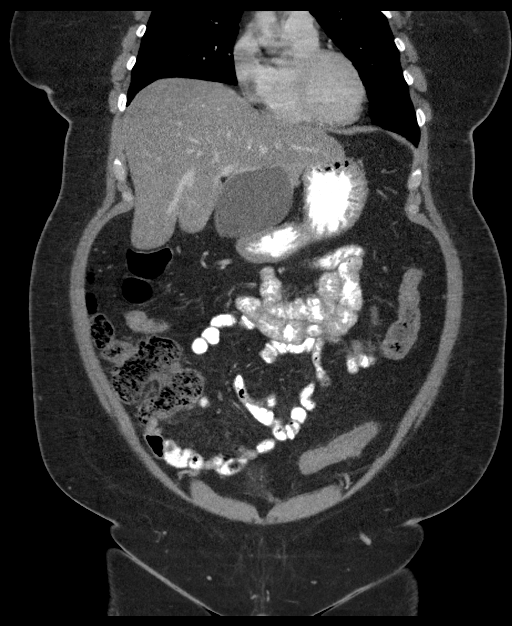
[im 38/86  soft-tissue]
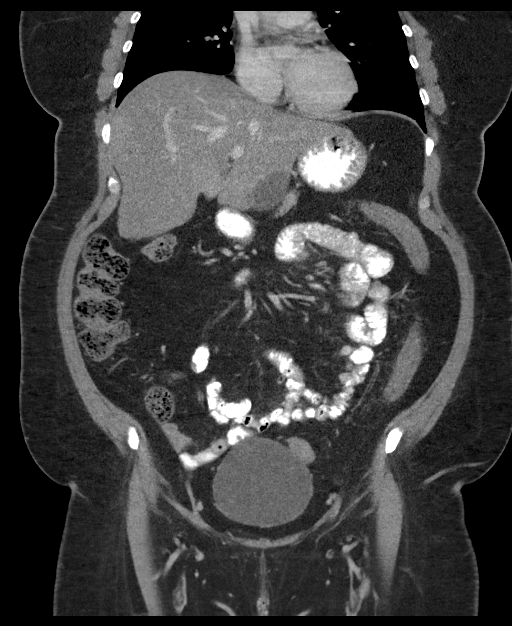
[im 48/86  soft-tissue]
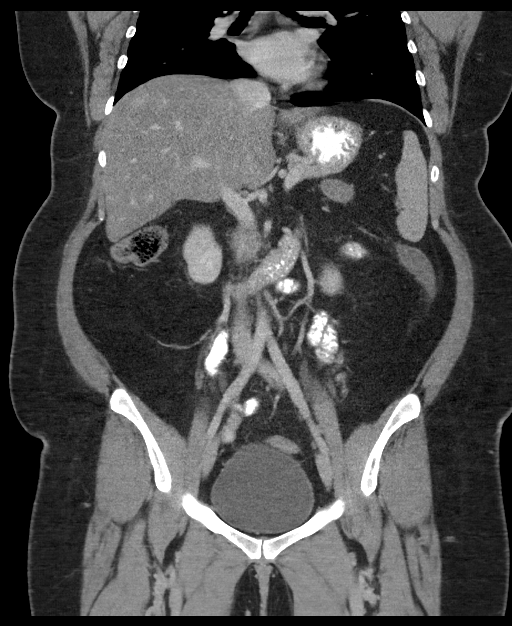

[16 of 46 positions shown; findings below may reference images not displayed]

FINDINGS: Lower chest: The visualized lung bases are clear.

No intra-abdominal free air or free fluid.

Hepatobiliary: Cholecystectomy. There is diffuse fatty infiltration
of the liver. Multiple hepatic cysts as seen on the prior MRI
measuring up to 7 cm in the left lobe of the liver. No intrahepatic
biliary ductal dilatation.

Pancreas: Unremarkable. No pancreatic ductal dilatation or
surrounding inflammatory changes.

Spleen: Normal in size without focal abnormality.

Adrenals/Urinary Tract: The adrenal glands are unremarkable. There
is no hydronephrosis or nephrolithiasis on either side. The
visualized ureters and urinary bladder appear unremarkable. There is
symmetric uptake and excretion of contrast by kidneys.

Stomach/Bowel: There is mild thickened appearance of the descending
colon with mild inflammatory changes most consistent with colitis.
Correlation with clinical exam and stool cultures recommended. The
ascending and transverse colon appear unremarkable. There is no
evidence of bowel obstruction. The visualized appendix appears
unremarkable.

Vascular/Lymphatic: No significant vascular findings are present. No
enlarged abdominal or pelvic lymph nodes.

Reproductive: Hysterectomy.

Other: Small fat containing umbilical hernia.

Musculoskeletal: No acute or significant osseous findings.
IMPRESSION: 1. Segmental colitis involving the descending colon, likely of
infectious and less likely inflammatory etiology. Correlation with
clinical exam and stool cultures recommended. Ischemic colitis is
less likely. No bowel obstruction.
2. Fatty liver with multiple liver cysts as seen on the prior MRI.

## 2019-04-16 ENCOUNTER — Other Ambulatory Visit: Payer: Self-pay

## 2019-04-16 ENCOUNTER — Encounter: Payer: Self-pay | Admitting: Obstetrics and Gynecology

## 2019-04-16 ENCOUNTER — Ambulatory Visit (INDEPENDENT_AMBULATORY_CARE_PROVIDER_SITE_OTHER): Payer: BC Managed Care – PPO | Admitting: Obstetrics and Gynecology

## 2019-04-16 VITALS — BP 122/76 | Ht 62.0 in | Wt 241.0 lb

## 2019-04-16 DIAGNOSIS — Z79890 Hormone replacement therapy (postmenopausal): Secondary | ICD-10-CM | POA: Diagnosis not present

## 2019-04-16 DIAGNOSIS — Z01419 Encounter for gynecological examination (general) (routine) without abnormal findings: Secondary | ICD-10-CM | POA: Diagnosis not present

## 2019-04-16 MED ORDER — ESTRADIOL 0.0375 MG/24HR TD PTTW: 1.0000 | MEDICATED_PATCH | TRANSDERMAL | 1 refills | Status: DC

## 2019-04-16 NOTE — Patient Instructions (Signed)
We will start by decreasing the estrogen patch to a half dose, 0.0375 mg twice weekly.  I would like you to continue this dose for 2-3 months and then let us know if you are doing fine, then we can consider dropping the dose further.

## 2019-04-16 NOTE — Progress Notes (Signed)
Mandy Bond 06-29-1965 [id number]  SUBJECTIVE:  54 y.o. G0P0 female for annual routine gynecologic exam. She has no gynecologic concerns.  Current Outpatient Medications  Medication Sig Dispense Refill  . estradiol (VIVELLE-DOT) 0.075 MG/24HR APPLY ONE PATCH ONTO THE SKIN TWICE WEEKLY 24 patch 0  . Omeprazole (PRILOSEC PO) Take by mouth.       Current Facility-Administered Medications  Medication Dose Route Frequency Provider Last Rate Last Admin  . 0.9 %  sodium chloride infusion  500 mL Intravenous Continuous Nandigam, Venia Minks, MD       Allergies: Macrobid [nitrofurantoin macrocrystal], Neomycin, and Vicodin [hydrocodone-acetaminophen]  No LMP recorded. Patient has had a hysterectomy.  Past medical history,surgical history, problem list, medications, allergies, family history and social history were all reviewed and documented as reviewed in the EPIC chart.  ROS:  Feeling well. No dyspnea or chest pain on exertion.  No abdominal pain, change in bowel habits, black or bloody stools.  No urinary tract symptoms. GYN ROS: no abnormal bleeding, pelvic pain or discharge, no breast pain or new or enlarging lumps on self exam. No neurological complaints.   OBJECTIVE:  Ht 5\' 2"  (1.575 m)   Wt 241 lb (109.3 kg)   BMI 44.08 kg/m  The patient appears well, alert, oriented x 3, in no distress. ENT normal.  Neck supple. No cervical or supraclavicular adenopathy or thyromegaly.  Lungs are clear, good air entry, no wheezes, rhonchi or rales. S1 and S2 normal, no murmurs, regular rate and rhythm.  Abdomen soft without tenderness, guarding, mass or organomegaly.  Neurological is normal, no focal findings.  BREAST EXAM: breasts appear normal, no suspicious masses, no skin or nipple changes or axillary nodes  PELVIC EXAM: VULVA: normal appearing vulva with no masses, tenderness or lesions, VAGINA: normal appearing vagina with normal color and discharge, no lesions, CERVIX: surgically  absent, UTERUS: surgically absent, vaginal cuff appears normal, ADNEXA: no masses, non tender  Chaperone: Mandy Bond present during the examination  ASSESSMENT:  54 y.o. G0P0 here for annual gynecologic exam  PLAN:   1.  Postmenopausal/HRT.  The patient remains on Vivelle-Dot 0.075 mg twice weekly.  She understands the risks of continued estrogen exposure to include thrombotic diseases such as heart attack, stroke, DVT, PE, and possible slight increased risk of breast cancer.  She has had a prior TAH/BSO.  We discussed goal of weaning after 10 years of use when she thinks she has been on HRT for at least that amount of time.  We will start by decreasing her Vivelle-Dot to half dose, 0.0375 mg twice weekly.  A new prescription is sent with refill to cover her for up to 6 months.  I would like her to continue this dose for 2-3 months and then she will let us know if she is doing fine we can consider dropping the dose further. 2. Pap smear 01/2018.  History of VAIN 1 on vaginal cuff biopsies in 2013/2014.  Normal Pap smears since then.  Next Pap smear due 2023 following the current guidelines recommending the 3 year interval. 3. Mammogram 11/2018.  Normal breast exam today.  She is reminded to schedule an annual mammogram this year when due. 4. Colonoscopy 2017.  Recommended that she follow up at the recommended interval.   5. DEXA never.  Recommend when further into menopause. 6. Health maintenance.  No labs today as she normally has these completed with her primary care provider.  Return annually or sooner, prn.  Joseph Pierini  MD, [name]  04/16/19

## 2019-09-29 DIAGNOSIS — Z6841 Body Mass Index (BMI) 40.0 and over, adult: Secondary | ICD-10-CM | POA: Diagnosis not present

## 2019-09-29 DIAGNOSIS — Z713 Dietary counseling and surveillance: Secondary | ICD-10-CM | POA: Diagnosis not present

## 2019-09-29 DIAGNOSIS — Z7182 Exercise counseling: Secondary | ICD-10-CM | POA: Diagnosis not present

## 2019-10-07 DIAGNOSIS — Z6841 Body Mass Index (BMI) 40.0 and over, adult: Secondary | ICD-10-CM | POA: Diagnosis not present

## 2019-10-07 DIAGNOSIS — Z713 Dietary counseling and surveillance: Secondary | ICD-10-CM | POA: Diagnosis not present

## 2019-10-07 DIAGNOSIS — M25561 Pain in right knee: Secondary | ICD-10-CM | POA: Diagnosis not present

## 2019-10-07 DIAGNOSIS — Z789 Other specified health status: Secondary | ICD-10-CM | POA: Diagnosis not present

## 2019-10-07 DIAGNOSIS — E669 Obesity, unspecified: Secondary | ICD-10-CM | POA: Diagnosis not present

## 2019-10-10 DIAGNOSIS — Z96652 Presence of left artificial knee joint: Secondary | ICD-10-CM | POA: Diagnosis not present

## 2019-10-10 DIAGNOSIS — M25561 Pain in right knee: Secondary | ICD-10-CM | POA: Diagnosis not present

## 2019-10-10 DIAGNOSIS — M545 Low back pain, unspecified: Secondary | ICD-10-CM | POA: Diagnosis not present

## 2019-10-10 DIAGNOSIS — M1711 Unilateral primary osteoarthritis, right knee: Secondary | ICD-10-CM | POA: Diagnosis not present

## 2019-10-10 DIAGNOSIS — M25562 Pain in left knee: Secondary | ICD-10-CM | POA: Diagnosis not present

## 2019-10-20 DIAGNOSIS — R946 Abnormal results of thyroid function studies: Secondary | ICD-10-CM | POA: Diagnosis not present

## 2019-10-20 DIAGNOSIS — K760 Fatty (change of) liver, not elsewhere classified: Secondary | ICD-10-CM | POA: Diagnosis not present

## 2019-10-20 DIAGNOSIS — R739 Hyperglycemia, unspecified: Secondary | ICD-10-CM | POA: Diagnosis not present

## 2019-10-20 DIAGNOSIS — Z0000 Encounter for general adult medical examination without abnormal findings: Secondary | ICD-10-CM | POA: Diagnosis not present

## 2019-10-22 DIAGNOSIS — M545 Low back pain, unspecified: Secondary | ICD-10-CM | POA: Diagnosis not present

## 2019-10-22 DIAGNOSIS — Z713 Dietary counseling and surveillance: Secondary | ICD-10-CM | POA: Diagnosis not present

## 2019-10-22 DIAGNOSIS — Z6841 Body Mass Index (BMI) 40.0 and over, adult: Secondary | ICD-10-CM | POA: Diagnosis not present

## 2019-10-22 DIAGNOSIS — E669 Obesity, unspecified: Secondary | ICD-10-CM | POA: Diagnosis not present

## 2019-10-27 DIAGNOSIS — Z0000 Encounter for general adult medical examination without abnormal findings: Secondary | ICD-10-CM | POA: Diagnosis not present

## 2019-10-27 DIAGNOSIS — R739 Hyperglycemia, unspecified: Secondary | ICD-10-CM | POA: Diagnosis not present

## 2019-10-27 DIAGNOSIS — R82998 Other abnormal findings in urine: Secondary | ICD-10-CM | POA: Diagnosis not present

## 2019-10-31 DIAGNOSIS — Z1231 Encounter for screening mammogram for malignant neoplasm of breast: Secondary | ICD-10-CM | POA: Diagnosis not present

## 2019-11-26 DIAGNOSIS — Z713 Dietary counseling and surveillance: Secondary | ICD-10-CM | POA: Diagnosis not present

## 2019-11-26 DIAGNOSIS — Z6841 Body Mass Index (BMI) 40.0 and over, adult: Secondary | ICD-10-CM | POA: Diagnosis not present

## 2019-11-26 DIAGNOSIS — K219 Gastro-esophageal reflux disease without esophagitis: Secondary | ICD-10-CM | POA: Diagnosis not present

## 2019-12-08 DIAGNOSIS — Z87898 Personal history of other specified conditions: Secondary | ICD-10-CM | POA: Diagnosis not present

## 2019-12-08 DIAGNOSIS — Z6841 Body Mass Index (BMI) 40.0 and over, adult: Secondary | ICD-10-CM | POA: Diagnosis not present

## 2019-12-08 DIAGNOSIS — R7303 Prediabetes: Secondary | ICD-10-CM | POA: Diagnosis not present

## 2019-12-08 DIAGNOSIS — Z713 Dietary counseling and surveillance: Secondary | ICD-10-CM | POA: Diagnosis not present

## 2019-12-09 DIAGNOSIS — Z1212 Encounter for screening for malignant neoplasm of rectum: Secondary | ICD-10-CM | POA: Diagnosis not present

## 2019-12-29 DIAGNOSIS — Z713 Dietary counseling and surveillance: Secondary | ICD-10-CM | POA: Diagnosis not present

## 2019-12-29 DIAGNOSIS — Z6841 Body Mass Index (BMI) 40.0 and over, adult: Secondary | ICD-10-CM | POA: Diagnosis not present

## 2020-01-12 DIAGNOSIS — G8929 Other chronic pain: Secondary | ICD-10-CM | POA: Diagnosis not present

## 2020-01-12 DIAGNOSIS — M25561 Pain in right knee: Secondary | ICD-10-CM | POA: Diagnosis not present

## 2020-01-12 DIAGNOSIS — Z6841 Body Mass Index (BMI) 40.0 and over, adult: Secondary | ICD-10-CM | POA: Diagnosis not present

## 2020-01-12 DIAGNOSIS — Z713 Dietary counseling and surveillance: Secondary | ICD-10-CM | POA: Diagnosis not present

## 2020-01-27 DIAGNOSIS — R590 Localized enlarged lymph nodes: Secondary | ICD-10-CM | POA: Diagnosis not present

## 2020-01-28 ENCOUNTER — Other Ambulatory Visit: Payer: Self-pay | Admitting: Family Medicine

## 2020-01-28 DIAGNOSIS — R590 Localized enlarged lymph nodes: Secondary | ICD-10-CM

## 2020-02-02 ENCOUNTER — Ambulatory Visit
Admission: RE | Admit: 2020-02-02 | Discharge: 2020-02-02 | Disposition: A | Discharge status: Home / Self Care | Payer: BC Managed Care – PPO | Source: Ambulatory Visit | Attending: Family Medicine | Admitting: Family Medicine

## 2020-02-02 DIAGNOSIS — R590 Localized enlarged lymph nodes: Secondary | ICD-10-CM | POA: Diagnosis not present

## 2020-02-11 DIAGNOSIS — Z803 Family history of malignant neoplasm of breast: Secondary | ICD-10-CM | POA: Diagnosis not present

## 2020-03-09 DIAGNOSIS — M25561 Pain in right knee: Secondary | ICD-10-CM | POA: Diagnosis not present

## 2020-03-09 DIAGNOSIS — M25562 Pain in left knee: Secondary | ICD-10-CM | POA: Diagnosis not present

## 2020-03-10 DIAGNOSIS — H524 Presbyopia: Secondary | ICD-10-CM | POA: Diagnosis not present

## 2020-03-10 DIAGNOSIS — H52203 Unspecified astigmatism, bilateral: Secondary | ICD-10-CM | POA: Diagnosis not present

## 2020-03-10 DIAGNOSIS — H1789 Other corneal scars and opacities: Secondary | ICD-10-CM | POA: Diagnosis not present

## 2020-03-10 DIAGNOSIS — H5203 Hypermetropia, bilateral: Secondary | ICD-10-CM | POA: Diagnosis not present

## 2020-07-05 DIAGNOSIS — U071 COVID-19: Secondary | ICD-10-CM | POA: Diagnosis not present

## 2020-08-19 DIAGNOSIS — L732 Hidradenitis suppurativa: Secondary | ICD-10-CM | POA: Diagnosis not present

## 2020-10-22 DIAGNOSIS — R739 Hyperglycemia, unspecified: Secondary | ICD-10-CM | POA: Diagnosis not present

## 2020-10-22 DIAGNOSIS — R946 Abnormal results of thyroid function studies: Secondary | ICD-10-CM | POA: Diagnosis not present

## 2020-10-29 DIAGNOSIS — R739 Hyperglycemia, unspecified: Secondary | ICD-10-CM | POA: Diagnosis not present

## 2020-10-29 DIAGNOSIS — Z23 Encounter for immunization: Secondary | ICD-10-CM | POA: Diagnosis not present

## 2020-10-29 DIAGNOSIS — R82998 Other abnormal findings in urine: Secondary | ICD-10-CM | POA: Diagnosis not present

## 2020-10-29 DIAGNOSIS — Z0000 Encounter for general adult medical examination without abnormal findings: Secondary | ICD-10-CM | POA: Diagnosis not present

## 2020-10-29 DIAGNOSIS — Z1331 Encounter for screening for depression: Secondary | ICD-10-CM | POA: Diagnosis not present

## 2020-11-05 DIAGNOSIS — Z1231 Encounter for screening mammogram for malignant neoplasm of breast: Secondary | ICD-10-CM | POA: Diagnosis not present

## 2021-03-01 ENCOUNTER — Encounter: Payer: Self-pay | Admitting: Gastroenterology

## 2021-03-09 ENCOUNTER — Encounter: Payer: Self-pay | Admitting: Gastroenterology

## 2021-03-11 DIAGNOSIS — H43813 Vitreous degeneration, bilateral: Secondary | ICD-10-CM | POA: Diagnosis not present

## 2021-03-11 DIAGNOSIS — H5203 Hypermetropia, bilateral: Secondary | ICD-10-CM | POA: Diagnosis not present

## 2021-03-11 DIAGNOSIS — H524 Presbyopia: Secondary | ICD-10-CM | POA: Diagnosis not present

## 2021-04-10 ENCOUNTER — Emergency Department (HOSPITAL_BASED_OUTPATIENT_CLINIC_OR_DEPARTMENT_OTHER)
Admission: EM | Admit: 2021-04-10 | Discharge: 2021-04-10 | Disposition: A | Discharge status: Home / Self Care | Payer: BC Managed Care – PPO | Attending: Emergency Medicine | Admitting: Emergency Medicine

## 2021-04-10 ENCOUNTER — Other Ambulatory Visit: Payer: Self-pay

## 2021-04-10 ENCOUNTER — Encounter (HOSPITAL_BASED_OUTPATIENT_CLINIC_OR_DEPARTMENT_OTHER): Payer: Self-pay

## 2021-04-10 DIAGNOSIS — E039 Hypothyroidism, unspecified: Secondary | ICD-10-CM | POA: Insufficient documentation

## 2021-04-10 DIAGNOSIS — Z96653 Presence of artificial knee joint, bilateral: Secondary | ICD-10-CM | POA: Diagnosis not present

## 2021-04-10 DIAGNOSIS — N3091 Cystitis, unspecified with hematuria: Secondary | ICD-10-CM | POA: Insufficient documentation

## 2021-04-10 DIAGNOSIS — R300 Dysuria: Secondary | ICD-10-CM | POA: Diagnosis not present

## 2021-04-10 LAB — URINALYSIS, MICROSCOPIC (REFLEX)
RBC / HPF: >50 RBC/hpf (ref 0–5)
WBC, UA: >50 WBC/hpf (ref 0–5)

## 2021-04-10 LAB — URINALYSIS, ROUTINE W REFLEX MICROSCOPIC
Bilirubin Urine: NEGATIVE
Glucose, UA: 100 mg/dL — AB
Ketones, ur: NEGATIVE mg/dL
Nitrite: POSITIVE — AB
Protein, ur: 100 mg/dL — AB
Specific Gravity, Urine: 1.015 (ref 1.005–1.030)
pH: 6 (ref 5.0–8.0)

## 2021-04-10 MED ORDER — CEPHALEXIN 250 MG PO CAPS
1000.0000 mg | ORAL_CAPSULE | Freq: Once | ORAL | Status: AC
Start: 2021-04-10 — End: 2021-04-10
  Administered 2021-04-10: 1000 mg via ORAL
  Filled 2021-04-10: qty 4

## 2021-04-10 MED ORDER — FLUCONAZOLE 150 MG PO TABS
ORAL_TABLET | ORAL | 0 refills | Status: DC
Start: 2021-04-10 — End: 2021-04-14

## 2021-04-10 MED ORDER — PHENAZOPYRIDINE HCL 100 MG PO TABS
200.0000 mg | ORAL_TABLET | Freq: Once | ORAL | Status: AC
  Administered 2021-04-10: 200 mg via ORAL
  Filled 2021-04-10: qty 2

## 2021-04-10 MED ORDER — PHENAZOPYRIDINE HCL 200 MG PO TABS: 200.0000 mg | ORAL_TABLET | Freq: Three times a day (TID) | ORAL | 0 refills | Status: DC | PRN

## 2021-04-10 MED ORDER — CEPHALEXIN 500 MG PO CAPS: 500.0000 mg | ORAL_CAPSULE | Freq: Two times a day (BID) | ORAL | 0 refills | Status: DC

## 2021-04-10 NOTE — ED Triage Notes (Signed)
Presented from home with c/o pain when urinating and frequent urination x1 week. Denies n/v/f ? ?Started taking AZO on  04/08/21 but denies relief.  ?

## 2021-04-10 NOTE — ED Provider Notes (Signed)
? ?[name] ?Provider Note: Mandy Spurling, MD, [place] ? ?CSN: [id number] ?MRN: [id number] ?ARRIVAL: 04/10/21 at [place] ?ROOM: MH07/MH07 ? ? ?CHIEF COMPLAINT  ?Dysuria ? ? ?HISTORY OF PRESENT ILLNESS  ?04/10/21 2:35 AM ?Mandy Bond is a 56 y.o. female with 1 week of frequent urination, urgency and pain with urinating.  She rates the pain as a 10 out of 10.  Not getting relief with over-the-counter Azo.  She has had no associated nausea, vomiting or fever, though she has had a sense of feeling hot and cold at the same time.  She is having no back pain.  She is having suprapubic pain. ? ? ?Past Medical History:  ?Diagnosis Date  ? AGUS favor benign 2007  ? w/u suggested endometriosis of cervix  ? Allergy   ? Arthritis   ? knees  ? Endometriosis 12/18/07  ? STAGE 4--TAH, BSO  ? GERD (gastroesophageal reflux disease)   ? Hypothyroid   ? pt denies- no medication for this   ? Inflammatory autoimmune disorder of skin   ? Seasonal allergies   ? Ulcer 2005-2006  ? past hx long ago per pt.  ? 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 negative high-risk HPV  on vaginal biopsy   ? ? ?Past Surgical History:  ?Procedure Laterality Date  ? ABDOMINAL HYSTERECTOMY  12/2007  ? TAH,BSO  ? ANKLE SURGERY    ? CHOLECYSTECTOMY    ? DILATION AND CURETTAGE OF UTERUS    ? HYSTEROSCOPY  12/2000, 08/2005  ? AND D&C  ? KNEE SURGERY Bilateral   ? NEURECTOMY FOOT    ? OOPHORECTOMY    ? BSO  ? PELVIC LAPAROSCOPY    ? DIAG LAP/STAGE 4 ENDOMETRIOSIS  ? REPLACEMENT TOTAL KNEE    ? TONSILLECTOMY    ? ? ?Family History  ?Problem Relation Age of Onset  ? Breast cancer Mother 75  ? Diabetes Mother   ? Cancer Father   ?     Lung cancer  ? Diabetes Sister   ? Hashimoto's thyroiditis Sister   ? Breast cancer Maternal Aunt   ?     28's  ? Colon cancer Neg Hx   ? Colon polyps Neg Hx   ? Esophageal cancer Neg Hx   ? Rectal cancer Neg Hx   ? Stomach cancer Neg Hx   ? ? ?Social History  ? ?Tobacco Use  ? Smoking status:  Never  ? Smokeless tobacco: Never  ?Vaping Use  ? Vaping Use: Never used  ?Substance Use Topics  ? Alcohol use: Yes  ?  Alcohol/week: 1.0 standard drink  ?  Types: 1 Standard drinks or equivalent per week  ?  Comment: occassional  ? Drug use: No  ? ? ?Prior to Admission medications   ?Medication Sig Start Date End Date Taking? Authorizing Provider  ?cephALEXin (KEFLEX) 500 MG capsule Take 1 capsule (500 mg total) by mouth 2 (two) times daily. 04/10/21  Yes Jamirah Zelaya, MD  ?fluconazole (DIFLUCAN) 150 MG tablet Take 1 tablet as needed for vaginal yeast infection.  May repeat in 3 days if symptoms persist. 04/10/21  Yes Kolbey Teichert, Jenny Reichmann, MD  ?phenazopyridine (PYRIDIUM) 200 MG tablet Take 1 tablet (200 mg total) by mouth 3 (three) times daily as needed for pain. 04/10/21  Yes Arieona Swaggerty, MD  ?estradiol (VIVELLE-DOT) 0.0375 MG/24HR Place 1 patch onto the skin 2 (two) times a week. 04/17/19   Joseph Pierini, MD  ?Omeprazole (PRILOSEC PO)  Take by mouth.      [provider]  ? ? ?Allergies ?Macrobid [nitrofurantoin macrocrystal], Neomycin, and Vicodin [hydrocodone-acetaminophen] ? ? ?REVIEW OF SYSTEMS  ?Negative except as noted here or in the History of Present Illness. ? ? ?PHYSICAL EXAMINATION  ?Initial Vital Signs ?Blood pressure 129/74, pulse 65, temperature 97.7 ?F (36.5 ?C), resp. rate 18, height '5\' 2"'$  (1.575 m), weight 84.4 kg, SpO2 96 %. ? ?Examination ?General: Well-developed, well-nourished female in no acute distress; appearance consistent with age of record ?HENT: normocephalic; atraumatic ?Eyes: Normal appearance ?Neck: supple ?Heart: regular rate and rhythm ?Lungs: clear to auscultation bilaterally ?Abdomen: soft; nondistended; suprapubic tenderness; bowel sounds present ?GU: No CVA tenderness; urine grossly cloudy ?Extremities: No deformity; full range of motion; pulses normal ?Neurologic: Awake, alert and oriented; motor function intact in all extremities and symmetric; no facial droop ?Skin: Warm and  dry ?Psychiatric: Normal mood and affect ? ? ?RESULTS  ?Summary of this visit's results, reviewed and interpreted by myself: ? ? EKG Interpretation ? ?Date/Time:    ?Ventricular Rate:    ?PR Interval:    ?QRS Duration:   ?QT Interval:    ?QTC Calculation:   ?R Axis:     ?Text Interpretation:   ?  ? ?  ? ?Laboratory Studies: ?Results for orders placed or performed during the hospital encounter of 04/10/21 (from the past 24 hour(s))  ?Urinalysis, Routine w reflex microscopic     Status: Abnormal  ? Collection Time: 04/10/21  2:36 AM  ?Result Value Ref Range  ? Color, Urine ORANGE (A) YELLOW  ? APPearance TURBID (A) CLEAR  ? Specific Gravity, Urine 1.015 1.005 - 1.030  ? pH 6.0 5.0 - 8.0  ? Glucose, UA 100 (A) NEGATIVE mg/dL  ? Hgb urine dipstick LARGE (A) NEGATIVE  ? Bilirubin Urine NEGATIVE NEGATIVE  ? Ketones, ur NEGATIVE NEGATIVE mg/dL  ? Protein, ur 100 (A) NEGATIVE mg/dL  ? Nitrite POSITIVE (A) NEGATIVE  ? Leukocytes,Ua LARGE (A) NEGATIVE  ?Urinalysis, Microscopic (reflex)     Status: Abnormal  ? Collection Time: 04/10/21  2:36 AM  ?Result Value Ref Range  ? RBC / HPF >50 0 - 5 RBC/hpf  ? WBC, UA >50 0 - 5 WBC/hpf  ? Bacteria, UA FEW (A) NONE SEEN  ? Squamous Epithelial / LPF 0-5 0 - 5  ? WBC Clumps PRESENT   ? ?Imaging Studies: ?No results found. ? ?ED COURSE and MDM  ?Nursing notes, initial and subsequent vitals signs, including pulse oximetry, reviewed and interpreted by myself. ? ?Vitals:  ? 04/10/21 0232 04/10/21 0233  ?BP: 129/74   ?Pulse: 65   ?Resp: 18   ?Temp: 97.7 ?F (36.5 ?C)   ?SpO2: 96%   ?Weight:  84.4 kg  ?Height:  '5\' 2"'$  (1.575 m)  ? ?Medications  ?cephALEXin (KEFLEX) capsule 1,000 mg (has no administration in time range)  ?phenazopyridine (PYRIDIUM) tablet 200 mg (has no administration in time range)  ? ? ?2:57 AM ?Urinalysis consistent with urinary tract infection, specifically hemorrhagic cystitis given the large amount of blood.  We will start on Keflex.  No flank pain or tenderness to suggest  pyelonephritis at this time ? ?PROCEDURES  ?Procedures ? ? ?ED DIAGNOSES  ? ?  ICD-10-CM   ?1. Cystitis with hematuria  N30.91   ?  ? ? ? ?  ?Shaarav Ripple, MD ?04/10/21 0300 ? ?

## 2021-04-11 LAB — URINE CULTURE: Culture: <10000 — AB

## 2021-04-14 ENCOUNTER — Ambulatory Visit (AMBULATORY_SURGERY_CENTER): Payer: BC Managed Care – PPO | Admitting: *Deleted

## 2021-04-14 VITALS — Ht 62.0 in | Wt 184.0 lb

## 2021-04-14 DIAGNOSIS — Z86010 Personal history of colonic polyps: Secondary | ICD-10-CM

## 2021-04-14 MED ORDER — NA SULFATE-K SULFATE-MG SULF 17.5-3.13-1.6 GM/177ML PO SOLN: 1.0000 | Freq: Once | ORAL | 0 refills | Status: AC

## 2021-04-14 NOTE — Progress Notes (Signed)
No egg or soy allergy known to patient  ?No issues known to pt with past sedation with any surgeries or procedures ?Patient denies ever being told they had issues or difficulty with intubation  ?No FH of Malignant Hyperthermia ?Pt is not on diet pills ?Pt is not on  home 02  ?Pt is not on blood thinners  ?Pt denies issues with constipation  ?No A fib or A flutter ? ?NO PA's for preps discussed with pt In PV today  ?Discussed with pt there will be an out-of-pocket cost for prep and that varies from $0 to 70 +  dollars - pt verbalized understanding  ?Pt instructed to use Singlecare.com or GoodRx for a price reduction on prep  ? ?PV completed over the phone. Pt verified name, DOB, address and insurance during PV today.  ?Pt mailed instruction packet with copy of consent form to read and not return, and instructions.  ?Pt encouraged to call with questions or issues.  ?If pt has My chart, procedure instructions sent via My Chart  ? ?

## 2021-04-26 ENCOUNTER — Encounter: Payer: Self-pay | Admitting: Gastroenterology

## 2021-05-05 ENCOUNTER — Encounter: Payer: Self-pay | Admitting: Gastroenterology

## 2021-05-05 ENCOUNTER — Telehealth: Payer: Self-pay | Admitting: *Deleted

## 2021-05-05 ENCOUNTER — Ambulatory Visit (AMBULATORY_SURGERY_CENTER): Payer: BC Managed Care – PPO | Admitting: Gastroenterology

## 2021-05-05 VITALS — BP 112/70 | HR 72 | Temp 97.7°F | Resp 9 | Ht 62.0 in | Wt 184.0 lb

## 2021-05-05 DIAGNOSIS — Z86010 Personal history of colonic polyps: Secondary | ICD-10-CM

## 2021-05-05 DIAGNOSIS — Z1211 Encounter for screening for malignant neoplasm of colon: Secondary | ICD-10-CM | POA: Diagnosis not present

## 2021-05-05 DIAGNOSIS — Z538 Procedure and treatment not carried out for other reasons: Secondary | ICD-10-CM | POA: Diagnosis not present

## 2021-05-05 MED ORDER — SODIUM CHLORIDE 0.9 % IV SOLN: 500.0000 mL | Freq: Once | INTRAVENOUS | Status: AC

## 2021-05-05 NOTE — Patient Instructions (Signed)

## 2021-05-05 NOTE — Op Note (Signed)
[facility] ?Patient Name: Mandy Bond ?Procedure Date: 05/05/2021 8:06 AM ?MRN: [id number] ?Endoscopist: Mauri Pole , MD ?Age: 56 ?Referring MD:  ?Date of Birth: [birth year] ?Gender: Female ?Account #: [id number] ?Procedure:                Colonoscopy ?Indications:              High risk colon cancer surveillance: Personal  ?                          history of colonic polyps, High risk colon cancer  ?                          surveillance: Personal history of adenoma less than  ?                          10 mm in size ?Medicines:                Monitored Anesthesia Care ?Procedure:                Pre-Anesthesia Assessment: ?                          - Prior to the procedure, a History and Physical  ?                          was performed, and patient medications and  ?                          allergies were reviewed. The patient's tolerance of  ?                          previous anesthesia was also reviewed. The risks  ?                          and benefits of the procedure and the sedation  ?                          options and risks were discussed with the patient.  ?                          All questions were answered, and informed consent  ?                          was obtained. Prior Anticoagulants: The patient has  ?                          taken no previous anticoagulant or antiplatelet  ?                          agents. ASA Grade Assessment: II - A patient with  ?                          mild systemic disease. After reviewing the risks  ?  and benefits, the patient was deemed in  ?                          satisfactory condition to undergo the procedure. ?                          After obtaining informed consent, the colonoscope  ?                          was passed under direct vision. Throughout the  ?                          procedure, the patient's blood pressure, pulse, and  ?                          oxygen saturations were monitored  continuously. The  ?                          [id number]-HQ190L Colonoscope was introduced through the  ?                          anus with the intention of advancing to the cecum.  ?                          The scope was advanced to the transverse colon  ?                          before the procedure was aborted. Medications were  ?                          given. The colonoscopy was performed without  ?                          difficulty. The patient tolerated the procedure  ?                          well. The quality of the bowel preparation was  ?                          poor. The rectum was photographed. ?Scope In: ?Scope Out: ?Findings:                 The perianal and digital rectal examinations were  ?                          normal. ?                          A moderate amount of stool was found in the entire  ?                          colon, interfering with visualization. Lavage of  ?                          the area was  performed, resulting in incomplete  ?                          clearance with continued poor visualization. ?Complications:            No immediate complications. ?Estimated Blood Loss:     Estimated blood loss: none. ?Impression:               - Preparation of the colon was poor. ?                          - Stool in the entire examined colon. ?                          - No specimens collected. ?Recommendation:           - Resume previous diet. ?                          - Continue present medications. ?                          - Repeat colonoscopy at the next available  ?                          appointment because the bowel preparation was  ?                          suboptimal. ?Mauri Pole, MD ?05/05/2021 8:30:21 AM ?This report has been signed electronically. ?

## 2021-05-05 NOTE — Progress Notes (Signed)
Pt awake, report to RN, [facility]  °

## 2021-05-05 NOTE — Progress Notes (Signed)
[facility] History and Physical ? ? ?Primary Care Physician:  Shon Baton, MD ? ? ?Reason for Procedure:  History of adenomatous colon polyps ? ?Plan:    Surveillance colonoscopy with possible interventions as needed ? ? ? ? ?HPI: Mandy Bond is a very pleasant 56 y.o. female here for surveillance colonoscopy. ?Denies any nausea, vomiting, abdominal pain, melena or bright red blood per rectum ? ?The risks and benefits as well as alternatives of endoscopic procedure(s) have been discussed and reviewed. All questions answered. The patient agrees to proceed. ? ? ? ?Past Medical History:  ?Diagnosis Date  ? AGUS favor benign 2007  ? w/u suggested endometriosis of cervix  ? Allergy   ? Arthritis   ? knees  ? Endometriosis 12/18/2007  ? STAGE 4--TAH, BSO  ? GERD (gastroesophageal reflux disease)   ? Hypothyroid   ? pt denies- no medication for this   ? Inflammatory autoimmune disorder of skin   ? controlled  ? Seasonal allergies   ? Ulcer 2005-2006  ? past hx long ago per pt.  ? 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 negative high-risk HPV  on vaginal biopsy   ? ? ?Past Surgical History:  ?Procedure Laterality Date  ? ABDOMINAL HYSTERECTOMY  12/2007  ? TAH,BSO  ? ANKLE SURGERY Right   ? CHOLECYSTECTOMY    ? COLONOSCOPY  2017  ? DILATION AND CURETTAGE OF UTERUS    ? HYSTEROSCOPY  12/2000, 08/2005  ? AND D&C  ? KNEE SURGERY Bilateral   ? scopes  ? NEURECTOMY FOOT    ? OOPHORECTOMY    ? BSO  ? PELVIC LAPAROSCOPY    ? DIAG LAP/STAGE 4 ENDOMETRIOSIS  ? POLYPECTOMY    ? 2017  ? REPLACEMENT TOTAL KNEE Left   ? TONSILLECTOMY    ? ? ?Prior to Admission medications   ?Medication Sig Start Date End Date Taking? Authorizing Provider  ?Omeprazole (PRILOSEC PO) Take by mouth as needed. OTC 20 mg PRN   Yes [provider]  ?cephALEXin (KEFLEX) 500 MG capsule Take 1 capsule (500 mg total) by mouth 2 (two) times daily. 04/10/21   Molpus, John, MD  ?ibuprofen (ADVIL) 200 MG tablet  Take by mouth.    [provider]  ? ? ?Current Outpatient Medications  ?Medication Sig Dispense Refill  ? Omeprazole (PRILOSEC PO) Take by mouth as needed. OTC 20 mg PRN    ? cephALEXin (KEFLEX) 500 MG capsule Take 1 capsule (500 mg total) by mouth 2 (two) times daily. 10 capsule 0  ? ibuprofen (ADVIL) 200 MG tablet Take by mouth.    ? ?Current Facility-Administered Medications  ?Medication Dose Route Frequency Provider Last Rate Last Admin  ? 0.9 %  sodium chloride infusion  500 mL Intravenous Continuous Billye Nydam V, MD      ? 0.9 %  sodium chloride infusion  500 mL Intravenous Once Ekin Pilar, Venia Minks, MD      ? ? ?Allergies as of 05/05/2021 - Review Complete 05/05/2021  ?Allergen Reaction Noted  ? Hydrocodone  10/10/2019  ? Macrobid [nitrofurantoin macrocrystal] Other (See Comments) 11/07/2012  ? Neomycin  09/23/2010  ? Vicodin [hydrocodone-acetaminophen]  09/23/2010  ? ? ?Family History  ?Problem Relation Age of Onset  ? Breast cancer Mother 63  ? Diabetes Mother   ? Cancer Father   ?     Lung cancer  ? Diabetes Sister   ? Hashimoto's thyroiditis Sister   ? Breast cancer Maternal  Aunt   ?     50's  ? Colon cancer Neg Hx   ? Colon polyps Neg Hx   ? Esophageal cancer Neg Hx   ? Rectal cancer Neg Hx   ? Stomach cancer Neg Hx   ? ? ?Social History  ? ?Socioeconomic History  ? Marital status: Single  ?  Spouse name: Not on file  ? Number of children: Not on file  ? Years of education: Not on file  ? Highest education level: Not on file  ?Occupational History  ? Not on file  ?Tobacco Use  ? Smoking status: Never  ? Smokeless tobacco: Never  ?Vaping Use  ? Vaping Use: Never used  ?Substance and Sexual Activity  ? Alcohol use: Yes  ?  Alcohol/week: 1.0 standard drink  ?  Types: 1 Standard drinks or equivalent per week  ?  Comment: occassional  ? Drug use: No  ? Sexual activity: Not Currently  ?  Birth control/protection: Surgical  ?  Comment: HYST-1st intercourse 64 yo-5 partners  ?Other Topics Concern   ? Not on file  ?Social History Narrative  ? Not on file  ? ?Social Determinants of Health  ? ?Financial Resource Strain: Not on file  ?Food Insecurity: Not on file  ?Transportation Needs: Not on file  ?Physical Activity: Not on file  ?Stress: Not on file  ?Social Connections: Not on file  ?Intimate Partner Violence: Not on file  ? ? ?Review of Systems: ? ?All other review of systems negative except as mentioned in the HPI. ? ?Physical Exam: ?Vital signs in last 24 hours: ?BP 115/71   Pulse 68   Temp 97.7 ?F (36.5 ?C) (Temporal)   Ht '5\' 2"'$  (1.575 m)   Wt 184 lb (83.5 kg)   SpO2 98%   BMI 33.65 kg/m?  ?General:   Alert, NAD ?Lungs:  Clear .   ?Heart:  Regular rate and rhythm ?Abdomen:  Soft, nontender and nondistended. ?Neuro/Psych:  Alert and cooperative. Normal mood and affect. A and O x 3 ? ?Reviewed labs, radiology imaging, old records and pertinent past GI work up ? ?Patient is appropriate for planned procedure(s) and anesthesia in an ambulatory setting ? ? ?K. Denzil Magnuson , MD ?[phone]  ? ? ?  ?

## 2021-05-05 NOTE — Telephone Encounter (Signed)
Rescheduled for Colonoscopy on May 10 at 10:30 with Dr. Silverio Decamp due to poor prep. Procedure instructions discussed with patient and sister. She verbalized understanding. ?

## 2021-05-09 ENCOUNTER — Telehealth: Payer: Self-pay | Admitting: *Deleted

## 2021-05-09 NOTE — Telephone Encounter (Signed)
?  Follow up Call- ? ? ?  05/05/2021  ?  7:09 AM  ?Call back number  ?Post procedure Call Back phone  # [phone]  ?Permission to leave phone message Yes  ?  ? ?Patient questions: ? ?Do you have a fever, pain , or abdominal swelling? No. ?Pain Score  0 * ? ?Have you tolerated food without any problems? No. ? ?Have you been able to return to your normal activities? Yes.   ? ?Do you have any questions about your discharge instructions: ?Diet   No. ?Medications  No. ?Follow up visit  No. ? ?Do you have questions or concerns about your Care? No. ? ?Actions: ?* If pain score is 4 or above: ?No action needed, pain <4. ? ? ?

## 2021-05-11 ENCOUNTER — Ambulatory Visit (AMBULATORY_SURGERY_CENTER): Payer: BC Managed Care – PPO | Admitting: Gastroenterology

## 2021-05-11 ENCOUNTER — Encounter: Payer: Self-pay | Admitting: Gastroenterology

## 2021-05-11 VITALS — BP 109/57 | HR 61 | Temp 97.3°F | Resp 10 | Ht 62.0 in | Wt 184.0 lb

## 2021-05-11 DIAGNOSIS — D123 Benign neoplasm of transverse colon: Secondary | ICD-10-CM

## 2021-05-11 DIAGNOSIS — D120 Benign neoplasm of cecum: Secondary | ICD-10-CM | POA: Diagnosis not present

## 2021-05-11 DIAGNOSIS — Z1211 Encounter for screening for malignant neoplasm of colon: Secondary | ICD-10-CM | POA: Diagnosis not present

## 2021-05-11 DIAGNOSIS — Z86010 Personal history of colonic polyps: Secondary | ICD-10-CM

## 2021-05-11 DIAGNOSIS — K635 Polyp of colon: Secondary | ICD-10-CM | POA: Diagnosis not present

## 2021-05-11 MED ORDER — SODIUM CHLORIDE 0.9 % IV SOLN: 500.0000 mL | Freq: Once | INTRAVENOUS | Status: DC

## 2021-05-11 NOTE — Progress Notes (Signed)
Pt's states no medical or surgical changes since previsit or office visit. 

## 2021-05-11 NOTE — Progress Notes (Signed)
[facility] History and Physical ? ? ?Primary Care Physician:  Shon Baton, MD ? ? ?Reason for Procedure:  History of adenomatous colon polyps ? ?Plan:    Surveillance colonoscopy with possible interventions as needed ? ? ? ? ?HPI: Mandy Bond is a very pleasant 56 y.o. female here for surveillance colonoscopy. ?Denies any nausea, vomiting, abdominal pain, melena or bright red blood per rectum ? ?The risks and benefits as well as alternatives of endoscopic procedure(s) have been discussed and reviewed. All questions answered. The patient agrees to proceed. ? ? ? ?Past Medical History:  ?Diagnosis Date  ? AGUS favor benign 2007  ? w/u suggested endometriosis of cervix  ? Allergy   ? Arthritis   ? knees  ? Endometriosis 12/18/2007  ? STAGE 4--TAH, BSO  ? GERD (gastroesophageal reflux disease)   ? Hypothyroid   ? pt denies- no medication for this   ? Inflammatory autoimmune disorder of skin   ? controlled  ? Seasonal allergies   ? Ulcer 2005-2006  ? past hx long ago per pt.  ? 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 negative high-risk HPV  on vaginal biopsy   ? ? ?Past Surgical History:  ?Procedure Laterality Date  ? ABDOMINAL HYSTERECTOMY  12/2007  ? TAH,BSO  ? ANKLE SURGERY Right   ? CHOLECYSTECTOMY    ? COLONOSCOPY  2017  ? DILATION AND CURETTAGE OF UTERUS    ? HYSTEROSCOPY  12/2000, 08/2005  ? AND D&C  ? KNEE SURGERY Bilateral   ? scopes  ? NEURECTOMY FOOT    ? OOPHORECTOMY    ? BSO  ? PELVIC LAPAROSCOPY    ? DIAG LAP/STAGE 4 ENDOMETRIOSIS  ? POLYPECTOMY    ? 2017  ? REPLACEMENT TOTAL KNEE Left   ? TONSILLECTOMY    ? ? ?Prior to Admission medications   ?Medication Sig Start Date End Date Taking? Authorizing Provider  ?Omeprazole (PRILOSEC PO) Take by mouth as needed. OTC 20 mg PRN   Yes [provider]  ? ? ?Current Outpatient Medications  ?Medication Sig Dispense Refill  ? Omeprazole (PRILOSEC PO) Take by mouth as needed. OTC 20 mg PRN    ? ?Current  Facility-Administered Medications  ?Medication Dose Route Frequency Provider Last Rate Last Admin  ? 0.9 %  sodium chloride infusion  500 mL Intravenous Once Juanna Pudlo V, MD      ? 0.9 %  sodium chloride infusion  500 mL Intravenous Once Aubriana Ravelo, Venia Minks, MD      ? ? ?Allergies as of 05/11/2021 - Review Complete 05/11/2021  ?Allergen Reaction Noted  ? Hydrocodone  10/10/2019  ? Macrobid [nitrofurantoin macrocrystal] Other (See Comments) 11/07/2012  ? Neomycin  09/23/2010  ? Vicodin [hydrocodone-acetaminophen]  09/23/2010  ? ? ?Family History  ?Problem Relation Age of Onset  ? Breast cancer Mother 70  ? Diabetes Mother   ? Cancer Father   ?     Lung cancer  ? Diabetes Sister   ? Hashimoto's thyroiditis Sister   ? Breast cancer Maternal Aunt   ?     19's  ? Colon cancer Neg Hx   ? Colon polyps Neg Hx   ? Esophageal cancer Neg Hx   ? Rectal cancer Neg Hx   ? Stomach cancer Neg Hx   ? ? ?Social History  ? ?Socioeconomic History  ? Marital status: Single  ?  Spouse name: Not on file  ? Number of children: Not on  file  ? Years of education: Not on file  ? Highest education level: Not on file  ?Occupational History  ? Not on file  ?Tobacco Use  ? Smoking status: Never  ? Smokeless tobacco: Never  ?Vaping Use  ? Vaping Use: Never used  ?Substance and Sexual Activity  ? Alcohol use: Yes  ?  Alcohol/week: 1.0 standard drink  ?  Types: 1 Standard drinks or equivalent per week  ?  Comment: occassional  ? Drug use: No  ? Sexual activity: Not Currently  ?  Birth control/protection: Surgical  ?  Comment: HYST-1st intercourse 37 yo-5 partners  ?Other Topics Concern  ? Not on file  ?Social History Narrative  ? Not on file  ? ?Social Determinants of Health  ? ?Financial Resource Strain: Not on file  ?Food Insecurity: Not on file  ?Transportation Needs: Not on file  ?Physical Activity: Not on file  ?Stress: Not on file  ?Social Connections: Not on file  ?Intimate Partner Violence: Not on file  ? ? ?Review of Systems: ? ?All  other review of systems negative except as mentioned in the HPI. ? ?Physical Exam: ?Vital signs in last 24 hours: ?BP 111/70   Pulse 67   Temp (!) 97.3 ?F (36.3 ?C)   Ht '5\' 2"'$  (1.575 m)   Wt 184 lb (83.5 kg)   SpO2 99%   BMI 33.65 kg/m?  ?General:   Alert, NAD ?Lungs:  Clear .   ?Heart:  Regular rate and rhythm ?Abdomen:  Soft, nontender and nondistended. ?Neuro/Psych:  Alert and cooperative. Normal mood and affect. A and O x 3 ? ?Reviewed labs, radiology imaging, old records and pertinent past GI work up ? ?Patient is appropriate for planned procedure(s) and anesthesia in an ambulatory setting ? ? ?K. Denzil Magnuson , MD ?[phone]  ? ? ?  ?

## 2021-05-11 NOTE — Patient Instructions (Signed)
YOU HAD AN ENDOSCOPIC PROCEDURE TODAY AT THE [facility]:   Refer to the procedure report that was given to you for any specific questions about what was found during the examination.  If the procedure report does not answer your questions, please call your gastroenterologist to clarify.  If you requested that your care partner not be given the details of your procedure findings, then the procedure report has been included in a sealed envelope for you to review at your convenience later.  YOU SHOULD EXPECT: Some feelings of bloating in the abdomen. Passage of more gas than usual.  Walking can help get rid of the air that was put into your GI tract during the procedure and reduce the bloating. If you had a lower endoscopy (such as a colonoscopy or flexible sigmoidoscopy) you may notice spotting of blood in your stool or on the toilet paper. If you underwent a bowel prep for your procedure, you may not have a normal bowel movement for a few days.  Please Note:  You might notice some irritation and congestion in your nose or some drainage.  This is from the oxygen used during your procedure.  There is no need for concern and it should clear up in a day or so.  SYMPTOMS TO REPORT IMMEDIATELY:   Following lower endoscopy (colonoscopy or flexible sigmoidoscopy):  Excessive amounts of blood in the stool  Significant tenderness or worsening of abdominal pains  Swelling of the abdomen that is new, acute  Fever of 100F or higher   Following upper endoscopy (EGD)  Vomiting of blood or coffee ground material  New chest pain or pain under the shoulder blades  Painful or persistently difficult swallowing  New shortness of breath  Fever of 100F or higher  Black, tarry-looking stools  For urgent or emergent issues, a gastroenterologist can be reached at any hour by calling [phone]. Do not use MyChart messaging for urgent concerns.    DIET:  We do recommend a small meal at first, but  then you may proceed to your regular diet.  Drink plenty of fluids but you should avoid alcoholic beverages for 24 hours.  ACTIVITY:  You should plan to take it easy for the rest of today and you should NOT DRIVE or use heavy machinery until tomorrow (because of the sedation medicines used during the test).    FOLLOW UP: Our staff will call the number listed on your records 48-72 hours following your procedure to check on you and address any questions or concerns that you may have regarding the information given to you following your procedure. If we do not reach you, we will leave a message.  We will attempt to reach you two times.  During this call, we will ask if you have developed any symptoms of COVID 19. If you develop any symptoms (ie: fever, flu-like symptoms, shortness of breath, cough etc.) before then, please call [phone].  If you test positive for Covid 19 in the 2 weeks post procedure, please call and report this information to us.    If any biopsies were taken you will be contacted by phone or by letter within the next 1-3 weeks.  Please call us at [phone] if you have not heard about the biopsies in 3 weeks.    SIGNATURES/CONFIDENTIALITY: You and/or your care partner have signed paperwork which will be entered into your electronic medical record.  These signatures attest to the fact that that the information above on   your After Visit Summary has been reviewed and is understood.  Full responsibility of the confidentiality of this discharge information lies with you and/or your care-partner. 

## 2021-05-11 NOTE — Progress Notes (Signed)
Called to room to assist during endoscopic procedure.  Patient ID and intended procedure confirmed with present staff. Received instructions for my participation in the procedure from the performing physician.  

## 2021-05-11 NOTE — Progress Notes (Signed)
Report to PACU, RN, vss, BBS= Clear.  

## 2021-05-11 NOTE — Op Note (Signed)
[facility] ?Patient Name: Mandy Bond ?Procedure Date: 05/11/2021 10:44 AM ?MRN: [id number] ?Endoscopist: Mauri Pole , MD ?Age: 56 ?Referring MD:  ?Date of Birth: 1965-07-04 ?Gender: Female ?Account #: [id number] ?Procedure:                Colonoscopy ?Indications:              High risk colon cancer surveillance: Personal  ?                          history of colonic polyps ?Medicines:                Monitored Anesthesia Care ?Procedure:                Pre-Anesthesia Assessment: ?                          - Prior to the procedure, a History and Physical  ?                          was performed, and patient medications and  ?                          allergies were reviewed. The patient's tolerance of  ?                          previous anesthesia was also reviewed. The risks  ?                          and benefits of the procedure and the sedation  ?                          options and risks were discussed with the patient.  ?                          All questions were answered, and informed consent  ?                          was obtained. Prior Anticoagulants: The patient has  ?                          taken no previous anticoagulant or antiplatelet  ?                          agents. ASA Grade Assessment: II - A patient with  ?                          mild systemic disease. After reviewing the risks  ?                          and benefits, the patient was deemed in  ?                          satisfactory condition to undergo the procedure. ?  After obtaining informed consent, the colonoscope  ?                          was passed under direct vision. Throughout the  ?                          procedure, the patient's blood pressure, pulse, and  ?                          oxygen saturations were monitored continuously. The  ?                          Olympus PCF-[id number] (#[id number]) Colonoscope was  ?                          introduced through the anus and  advanced to the the  ?                          cecum, identified by appendiceal orifice and  ?                          ileocecal valve. The colonoscopy was technically  ?                          difficult and complex due to inadequate bowel prep.  ?                          Successful completion of the procedure was aided by  ?                          lavage. The patient tolerated the procedure well.  ?                          The quality of the bowel preparation was adequate.  ?                          The ileocecal valve, appendiceal orifice, and  ?                          rectum were photographed. ?Scope In: 10:55:02 AM ?Scope Out: 11:15:07 AM ?Scope Withdrawal Time: 0 hours 13 minutes 39 seconds  ?Total Procedure Duration: 0 hours 20 minutes 5 seconds  ?Findings:                 The perianal and digital rectal examinations were  ?                          normal. ?                          A less than 1 mm polyp was found in the cecum. The  ?                          polyp was sessile. The polyp was removed with a  ?  cold biopsy forceps. Resection and retrieval were  ?                          complete. ?                          Two sessile polyps were found in the transverse  ?                          colon and ascending colon. The polyps were 5 to 7  ?                          mm in size. These polyps were removed with a cold  ?                          snare. Resection and retrieval were complete. ?                          Scattered small-mouthed diverticula were found in  ?                          the sigmoid colon. ?                          Non-bleeding external and internal hemorrhoids were  ?                          found during retroflexion. The hemorrhoids were  ?                          medium-sized. ?Complications:            No immediate complications. ?Estimated Blood Loss:     Estimated blood loss was minimal. ?Impression:               - One less than 1 mm  polyp in the cecum, removed  ?                          with a cold biopsy forceps. Resected and retrieved. ?                          - Two 5 to 7 mm polyps in the transverse colon and  ?                          in the ascending colon, removed with a cold snare.  ?                          Resected and retrieved. ?                          - Diverticulosis in the sigmoid colon. ?                          - Non-bleeding external and internal hemorrhoids. ?Recommendation:           -  Patient has a contact number available for  ?                          emergencies. The signs and symptoms of potential  ?                          delayed complications were discussed with the  ?                          patient. Return to normal activities tomorrow.  ?                          Written discharge instructions were provided to the  ?                          patient. ?                          - Resume previous diet. ?                          - Continue present medications. ?                          - Await pathology results. ?                          - Repeat colonoscopy in 3 - 5 years for  ?                          surveillance based on pathology results. ?Mauri Pole, MD ?05/11/2021 11:19:43 AM ?This report has been signed electronically. ?

## 2021-05-12 ENCOUNTER — Encounter: Payer: Self-pay | Admitting: Gastroenterology

## 2021-05-13 ENCOUNTER — Telehealth: Payer: Self-pay

## 2021-05-13 ENCOUNTER — Telehealth: Payer: Self-pay | Admitting: *Deleted

## 2021-05-13 NOTE — Telephone Encounter (Signed)
Second attempt follow up call to pt, lm for pt to call if having any problems or questions.  ?

## 2021-05-13 NOTE — Telephone Encounter (Signed)
First attempt, left VM.  

## 2021-05-24 ENCOUNTER — Encounter: Payer: Self-pay | Admitting: Gastroenterology

## 2021-10-06 DIAGNOSIS — L438 Other lichen planus: Secondary | ICD-10-CM | POA: Diagnosis not present

## 2021-10-06 DIAGNOSIS — L732 Hidradenitis suppurativa: Secondary | ICD-10-CM | POA: Diagnosis not present

## 2021-11-07 DIAGNOSIS — R739 Hyperglycemia, unspecified: Secondary | ICD-10-CM | POA: Diagnosis not present

## 2021-11-07 DIAGNOSIS — Z79899 Other long term (current) drug therapy: Secondary | ICD-10-CM | POA: Diagnosis not present

## 2021-11-07 DIAGNOSIS — R946 Abnormal results of thyroid function studies: Secondary | ICD-10-CM | POA: Diagnosis not present

## 2021-11-07 DIAGNOSIS — R5383 Other fatigue: Secondary | ICD-10-CM | POA: Diagnosis not present

## 2021-11-17 DIAGNOSIS — Z23 Encounter for immunization: Secondary | ICD-10-CM | POA: Diagnosis not present

## 2021-11-17 DIAGNOSIS — R739 Hyperglycemia, unspecified: Secondary | ICD-10-CM | POA: Diagnosis not present

## 2021-11-17 DIAGNOSIS — Z1331 Encounter for screening for depression: Secondary | ICD-10-CM | POA: Diagnosis not present

## 2021-11-17 DIAGNOSIS — Z0000 Encounter for general adult medical examination without abnormal findings: Secondary | ICD-10-CM | POA: Diagnosis not present

## 2021-11-17 DIAGNOSIS — Z1389 Encounter for screening for other disorder: Secondary | ICD-10-CM | POA: Diagnosis not present

## 2022-03-20 DIAGNOSIS — H52201 Unspecified astigmatism, right eye: Secondary | ICD-10-CM | POA: Diagnosis not present

## 2022-03-20 DIAGNOSIS — H5201 Hypermetropia, right eye: Secondary | ICD-10-CM | POA: Diagnosis not present

## 2022-03-20 DIAGNOSIS — H43813 Vitreous degeneration, bilateral: Secondary | ICD-10-CM | POA: Diagnosis not present

## 2022-08-25 ENCOUNTER — Emergency Department (HOSPITAL_BASED_OUTPATIENT_CLINIC_OR_DEPARTMENT_OTHER)
Admission: EM | Admit: 2022-08-25 | Discharge: 2022-08-25 | Disposition: A | Discharge status: Home / Self Care | Payer: BC Managed Care – PPO | Attending: Emergency Medicine | Admitting: Emergency Medicine

## 2022-08-25 ENCOUNTER — Other Ambulatory Visit: Payer: Self-pay

## 2022-08-25 ENCOUNTER — Encounter (HOSPITAL_BASED_OUTPATIENT_CLINIC_OR_DEPARTMENT_OTHER): Payer: Self-pay

## 2022-08-25 DIAGNOSIS — S60932A Unspecified superficial injury of left thumb, initial encounter: Secondary | ICD-10-CM | POA: Diagnosis not present

## 2022-08-25 DIAGNOSIS — S61012A Laceration without foreign body of left thumb without damage to nail, initial encounter: Secondary | ICD-10-CM

## 2022-08-25 DIAGNOSIS — S61122A Laceration with foreign body of left thumb with damage to nail, initial encounter: Secondary | ICD-10-CM | POA: Diagnosis not present

## 2022-08-25 DIAGNOSIS — W260XXA Contact with knife, initial encounter: Secondary | ICD-10-CM | POA: Insufficient documentation

## 2022-08-25 DIAGNOSIS — Y92000 Kitchen of unspecified non-institutional (private) residence as  the place of occurrence of the external cause: Secondary | ICD-10-CM | POA: Diagnosis not present

## 2022-08-25 DIAGNOSIS — Z23 Encounter for immunization: Secondary | ICD-10-CM | POA: Diagnosis not present

## 2022-08-25 MED ORDER — TETANUS-DIPHTH-ACELL PERTUSSIS 5-2.5-18.5 LF-MCG/0.5 IM SUSY
0.5000 mL | PREFILLED_SYRINGE | Freq: Once | INTRAMUSCULAR | Status: AC
  Administered 2022-08-25: 0.5 mL via INTRAMUSCULAR
  Filled 2022-08-25: qty 0.5

## 2022-08-25 NOTE — ED Triage Notes (Addendum)
POV from home, A&O x 4, amb to room  Laceration to left thumb with kitchen knife last night approx 1830, bleeding controlled after liquid bandage and bandaid applied but started bleeding again this AM. Unsure if TDAP is within 5 years.

## 2022-08-25 NOTE — ED Provider Notes (Signed)
[facility] Provider Note   CSN: [id number] Arrival date & time: 08/25/22  2595     History  Chief Complaint  Patient presents with   Extremity Laceration    Mandy Bond is a 57 y.o. female.  Patient here with thumb laceration.  Washed it out and put some liquid Band-Aid on it last night but rebled this morning.  Tetanus shot not up-to-date.  No numbness tingling weakness.  No other injuries.  The history is provided by the patient.       Home Medications Prior to Admission medications   Medication Sig Start Date End Date Taking? Authorizing Provider  Omeprazole (PRILOSEC PO) Take by mouth as needed. OTC 20 mg PRN    [provider]      Allergies    Hydrocodone, Macrobid [nitrofurantoin macrocrystal], Neomycin, and Vicodin [hydrocodone-acetaminophen]    Review of Systems   Review of Systems  Physical Exam Updated Vital Signs BP 127/80   Pulse 78   Temp 98 F (36.7 C)   Resp 18   Ht 5\' 2"  (1.575 m)   Wt 102.5 kg   SpO2 98%   BMI 41.34 kg/m  Physical Exam Cardiovascular:     Pulses: Normal pulses.  Skin:    Comments: Superficial laceration to the pad of the left thumb hemostatic  Neurological:     Mental Status: She is alert.     Sensory: No sensory deficit.     Motor: No weakness.     ED Results / Procedures / Treatments   Labs (all labs ordered are listed, but only abnormal results are displayed) Labs Reviewed - No data to display  EKG None  Radiology No results found.  Procedures .Marland KitchenLaceration Repair  Date/Time: 08/25/2022 7:18 AM  Performed by: Virgina Norfolk, DO Authorized by: Virgina Norfolk, DO   Consent:    Consent obtained:  Verbal   Consent given by:  Patient   Risks, benefits, and alternatives were discussed: yes     Risks discussed:  Need for additional repair, nerve damage, pain, infection, poor cosmetic result, poor wound healing, vascular damage, tendon damage and  retained foreign body   Alternatives discussed:  No treatment Universal protocol:    Procedure explained and questions answered to patient or proxy's satisfaction: yes     Patient identity confirmed:  Verbally with patient Laceration details:    Location:  Finger   Finger location:  L thumb   Length (cm):  2   Depth (mm):  1 Pre-procedure details:    Preparation:  Patient was prepped and draped in usual sterile fashion Exploration:    Imaging outcome: foreign body not noted     Wound exploration: wound explored through full range of motion and entire depth of wound visualized     Wound extent: areolar tissue not violated, fascia not violated, no foreign body, no signs of injury, no nerve damage, no tendon damage, no underlying fracture and no vascular damage   Treatment:    Area cleansed with:  Shur-Clens   Amount of cleaning:  Standard Skin repair:    Repair method:  Tissue adhesive Approximation:    Approximation:  Close Repair type:    Repair type:  Simple Post-procedure details:    Dressing:  Non-adherent dressing   Procedure completion:  Tolerated     Medications Ordered in ED Medications  Tdap (BOOSTRIX) injection 0.5 mL (has no administration in time range)    ED Course/ Medical  Decision Making/ A&P                                 Medical Decision Making Risk Prescription drug management.   Mandy Bond is here with left thumb laceration.  Normal vitals.  No fever.  Neurovascular neuromuscular intact.  Tetanus shot updated in the ED.  Injury occurred last night.  Washed out last night and put a little bit of liquid Band-Aid but still had some bleeding this morning.  Washed out again this morning.  Dermabond in place.  Overall I have very low suspicion for increased infection risk given delayed closure.  Wound looked very clean.  Overall understands wound care instructions.  Discharged in good condition.  Understands return precautions.  This chart was dictated  using voice recognition software.  Despite best efforts to proofread,  errors can occur which can change the documentation meaning.         Final Clinical Impression(s) / ED Diagnoses Final diagnoses:  Laceration of left thumb, foreign body presence unspecified, nail damage status unspecified, initial encounter    Rx / DC Orders ED Discharge Orders     None         Virgina Norfolk, DO 08/25/22 0720

## 2022-08-25 NOTE — ED Notes (Signed)
Bacitracin applied to dissolve liquid bandage.

## 2022-09-25 DIAGNOSIS — M791 Myalgia, unspecified site: Secondary | ICD-10-CM | POA: Diagnosis not present

## 2022-09-25 DIAGNOSIS — R5383 Other fatigue: Secondary | ICD-10-CM | POA: Diagnosis not present

## 2022-11-13 DIAGNOSIS — R739 Hyperglycemia, unspecified: Secondary | ICD-10-CM | POA: Diagnosis not present

## 2022-11-13 DIAGNOSIS — R946 Abnormal results of thyroid function studies: Secondary | ICD-10-CM | POA: Diagnosis not present

## 2022-11-13 DIAGNOSIS — R7989 Other specified abnormal findings of blood chemistry: Secondary | ICD-10-CM | POA: Diagnosis not present

## 2022-11-20 DIAGNOSIS — Z0000 Encounter for general adult medical examination without abnormal findings: Secondary | ICD-10-CM | POA: Diagnosis not present

## 2022-11-20 DIAGNOSIS — Z23 Encounter for immunization: Secondary | ICD-10-CM | POA: Diagnosis not present

## 2022-11-20 DIAGNOSIS — R82998 Other abnormal findings in urine: Secondary | ICD-10-CM | POA: Diagnosis not present

## 2022-11-20 DIAGNOSIS — R739 Hyperglycemia, unspecified: Secondary | ICD-10-CM | POA: Diagnosis not present

## 2022-11-20 DIAGNOSIS — Z1331 Encounter for screening for depression: Secondary | ICD-10-CM | POA: Diagnosis not present

## 2022-12-30 IMAGING — US US SOFT TISSUE HEAD/NECK
1 series · 12 of 12 positions shown · non-contrast
Comparison: No pertinent prior exams available for comparison.

CLINICAL DATA: Provided history: Localized enlarged lymph nodes
additional history provided by scanning technologist: Patient
reports palpable area in left submandibular region laterally felt on
physical exam.

EXAM:
ULTRASOUND OF HEAD/NECK SOFT TISSUES
TECHNIQUE: Ultrasound examination of the head and neck soft tissues was
performed in the area of clinical concern.

[Series 1: us soft tissue head/neck · 0.06mm/px · 12 of 12 slices shown]
[im 1/12]
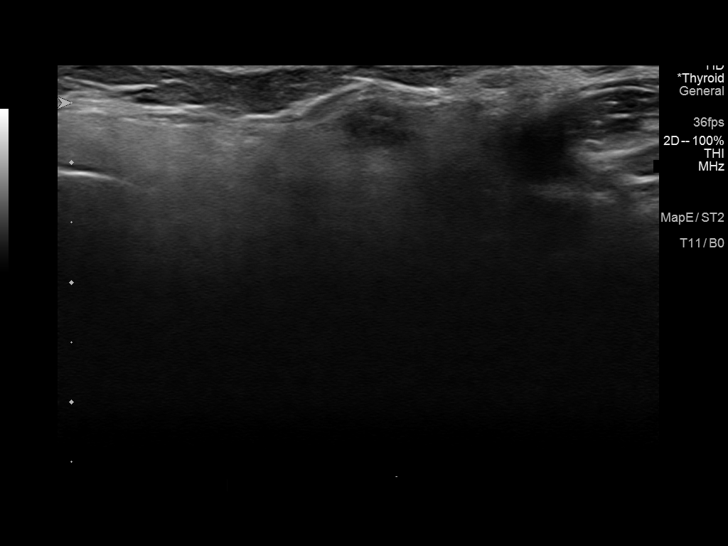
[im 2/12]
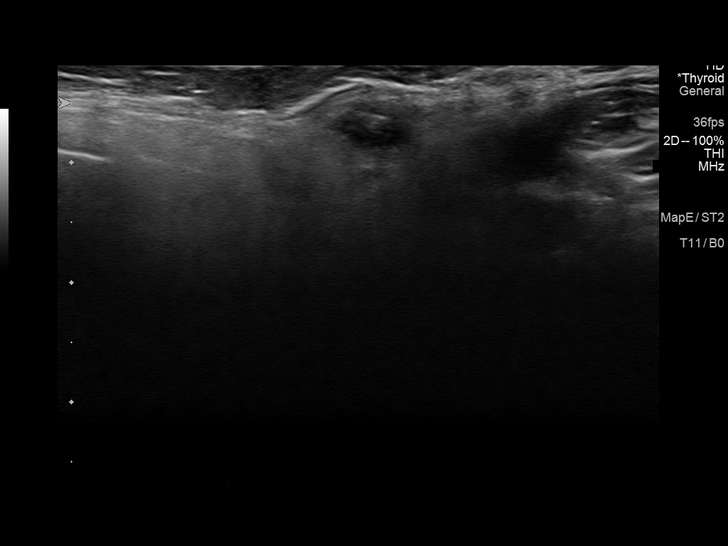
[im 3/12]
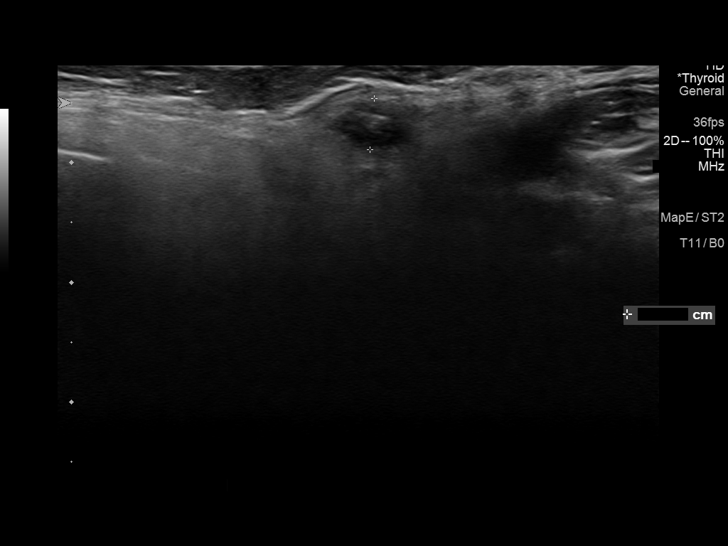
[im 4/12]
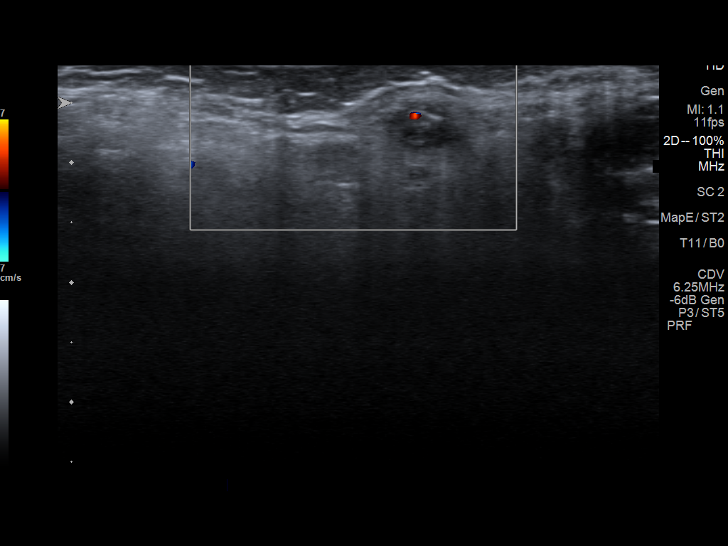
[im 5/12]
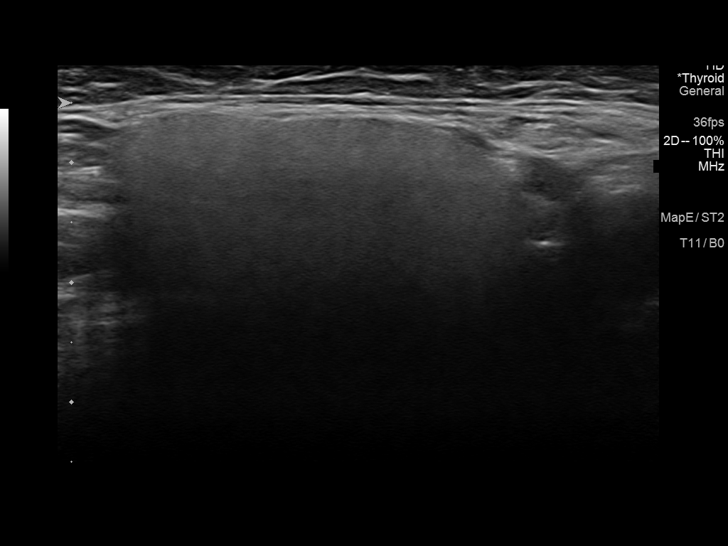
[im 6/12]
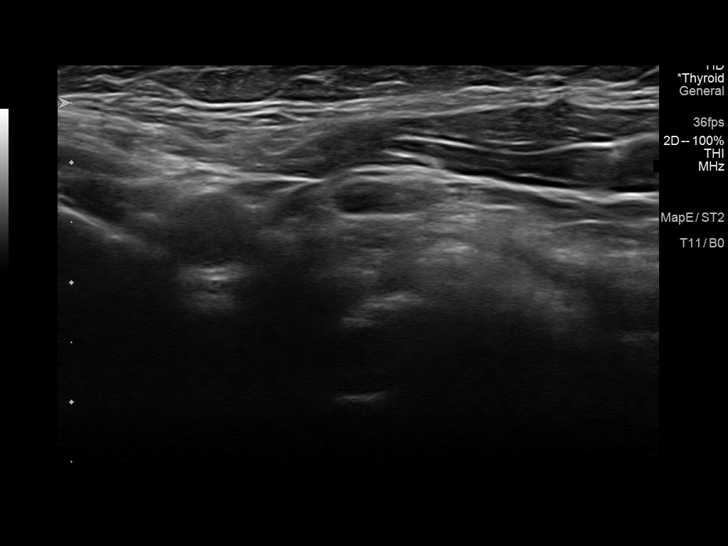
[im 7/12]
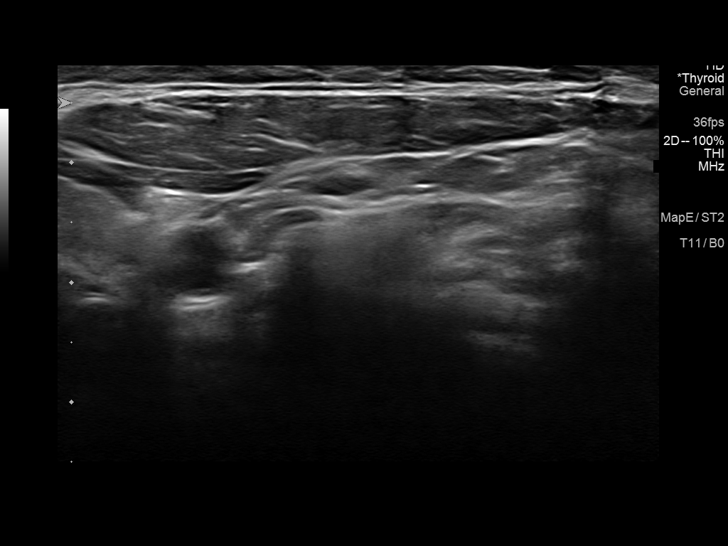
[im 8/12]
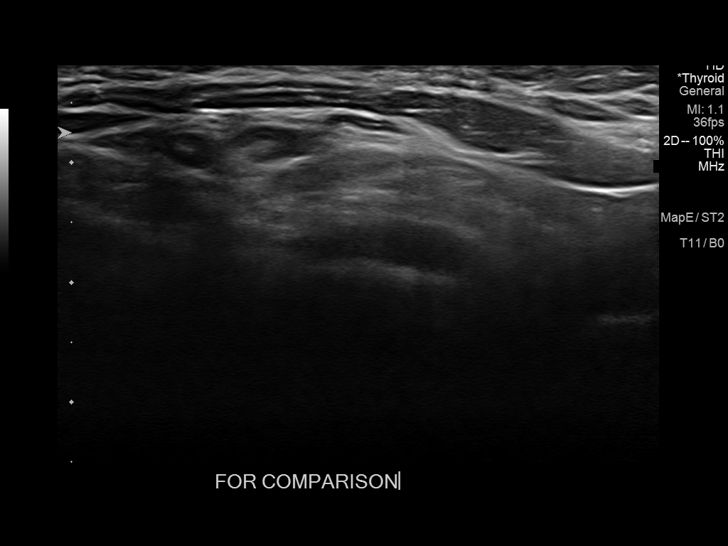
[im 9/12]
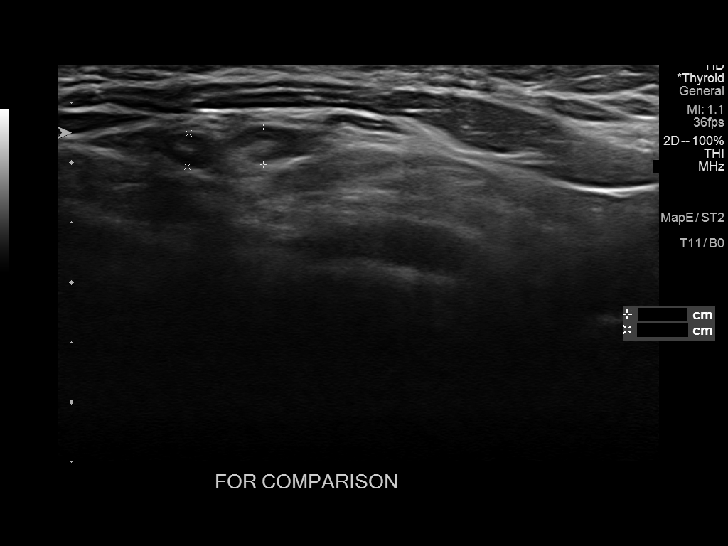
[im 10/12]
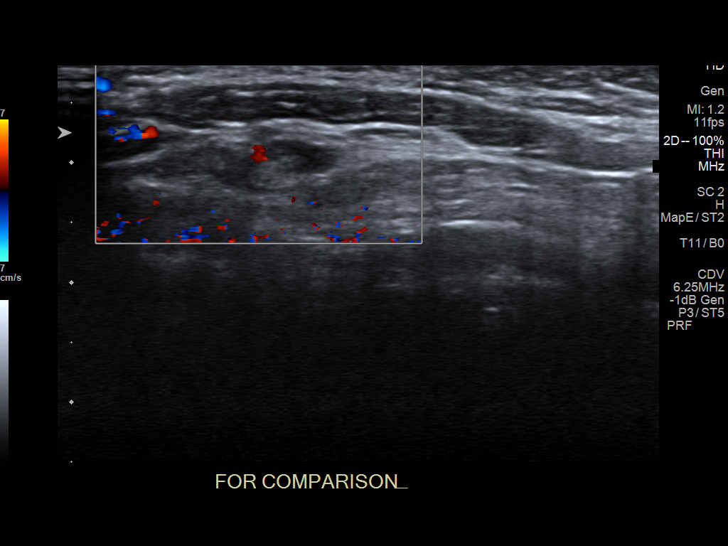
[im 11/12]
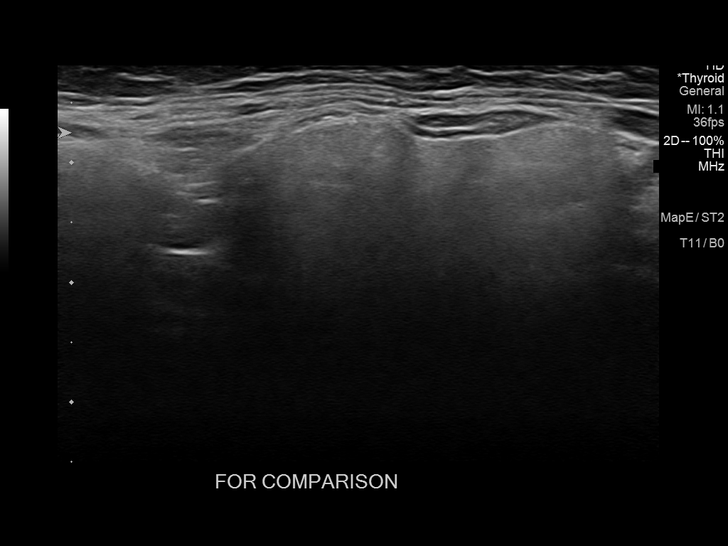
[im 12/12]
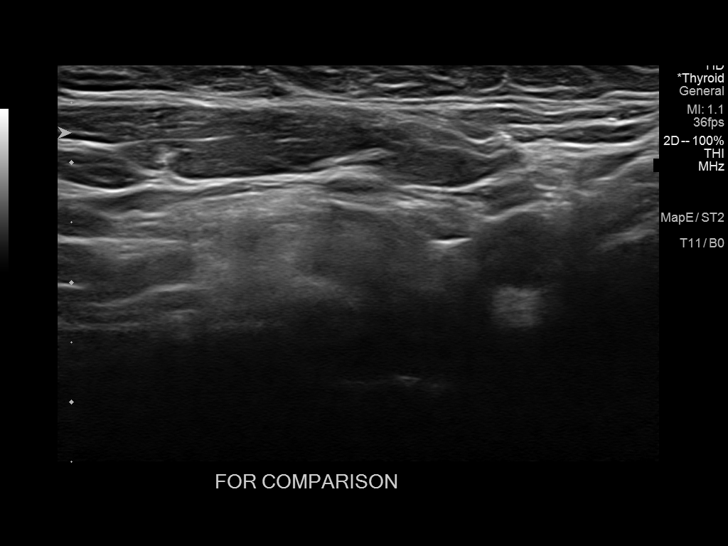

[12 of 12 positions shown; findings below may reference images not displayed]

FINDINGS: Targeted ultrasound of the left submandibular region of concern was
performed. Limited imaging was also formed of the contralateral
right submandibular region for the purposes of comparison. In the
left submandibular region of concern, there are ovoid lymph nodes
measuring up to 4 mm in short axis and demonstrating normal fatty
hila. There is no appreciable mass at this site. Nonenlarged lymph
nodes are also demonstrated within the right submandibular neck.
IMPRESSION: No appreciable mass in the left submandibular region of concern.
Non-enlarged lymph nodes are demonstrated at this site, measuring up
to 4 mm in short axis and demonstrating normal fatty hila. A
contrast-enhanced neck CT may be obtained for further evaluation, if
clinically warranted.

## 2023-02-27 DIAGNOSIS — M25562 Pain in left knee: Secondary | ICD-10-CM | POA: Diagnosis not present

## 2023-02-27 DIAGNOSIS — M25561 Pain in right knee: Secondary | ICD-10-CM | POA: Diagnosis not present

## 2023-05-22 DIAGNOSIS — R739 Hyperglycemia, unspecified: Secondary | ICD-10-CM | POA: Diagnosis not present

## 2023-10-11 DIAGNOSIS — Z1231 Encounter for screening mammogram for malignant neoplasm of breast: Secondary | ICD-10-CM | POA: Diagnosis not present

## 2023-10-17 DIAGNOSIS — H52203 Unspecified astigmatism, bilateral: Secondary | ICD-10-CM | POA: Diagnosis not present

## 2023-10-17 DIAGNOSIS — H43813 Vitreous degeneration, bilateral: Secondary | ICD-10-CM | POA: Diagnosis not present

## 2023-10-17 DIAGNOSIS — H5203 Hypermetropia, bilateral: Secondary | ICD-10-CM | POA: Diagnosis not present

## 2023-10-19 DIAGNOSIS — N3000 Acute cystitis without hematuria: Secondary | ICD-10-CM | POA: Diagnosis not present

## 2023-11-08 DIAGNOSIS — M1711 Unilateral primary osteoarthritis, right knee: Secondary | ICD-10-CM | POA: Diagnosis not present

## 2023-11-08 DIAGNOSIS — Z96652 Presence of left artificial knee joint: Secondary | ICD-10-CM | POA: Diagnosis not present

## 2023-11-19 DIAGNOSIS — R946 Abnormal results of thyroid function studies: Secondary | ICD-10-CM | POA: Diagnosis not present

## 2023-11-19 DIAGNOSIS — R739 Hyperglycemia, unspecified: Secondary | ICD-10-CM | POA: Diagnosis not present

## 2023-11-26 DIAGNOSIS — Z1331 Encounter for screening for depression: Secondary | ICD-10-CM | POA: Diagnosis not present

## 2023-11-26 DIAGNOSIS — Z1389 Encounter for screening for other disorder: Secondary | ICD-10-CM | POA: Diagnosis not present

## 2023-11-26 DIAGNOSIS — Z23 Encounter for immunization: Secondary | ICD-10-CM | POA: Diagnosis not present

## 2023-11-26 DIAGNOSIS — Z0000 Encounter for general adult medical examination without abnormal findings: Secondary | ICD-10-CM | POA: Diagnosis not present

## 2023-11-26 DIAGNOSIS — R739 Hyperglycemia, unspecified: Secondary | ICD-10-CM | POA: Diagnosis not present

## 2024-01-11 ENCOUNTER — Ambulatory Visit: Admitting: Rehabilitation

## 2024-01-14 ENCOUNTER — Ambulatory Visit

## 2024-01-16 ENCOUNTER — Ambulatory Visit: Admitting: Rehabilitation

## 2024-01-16 NOTE — Therapy (Signed)
 " OUTPATIENT PHYSICAL THERAPY LOWER EXTREMITY EVALUATION   Patient Name: Mandy Bond MRN: [id number] DOB:07-28-1965, 59 y.o., female Today's Date: 01/21/2024   END OF SESSION:  PT End of Session - 01/21/24 0937     Visit Number 1    Date for Recertification  03/17/24    PT Start Time 0934    PT Stop Time 1035    PT Time Calculation (min) 61 min    Activity Tolerance Patient tolerated treatment well;Patient limited by fatigue    Behavior During Therapy [facility] for tasks assessed/performed          Past Medical History:  Diagnosis Date   AGUS favor benign 2007   w/u suggested endometriosis of cervix   Allergy    Arthritis    knees   Endometriosis 12/18/2007   STAGE 4--TAH, BSO   GERD (gastroesophageal reflux disease)    Hypothyroid    pt denies- no medication for this    Inflammatory autoimmune disorder of skin    controlled   Seasonal allergies    Ulcer 2005-2006   past hx long ago per pt.   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, 11/2012   negative high-risk HPV  on vaginal biopsy    Past Surgical History:  Procedure Laterality Date   ABDOMINAL HYSTERECTOMY  12/2007   TAH,BSO   ANKLE SURGERY Right    CHOLECYSTECTOMY     COLONOSCOPY  2017   DILATION AND CURETTAGE OF UTERUS     HYSTEROSCOPY  12/2000, 08/2005   AND D&C   KNEE SURGERY Bilateral    scopes   NEURECTOMY FOOT     OOPHORECTOMY     BSO   PELVIC LAPAROSCOPY     DIAG LAP/STAGE 4 ENDOMETRIOSIS   POLYPECTOMY     2017   REPLACEMENT TOTAL KNEE Left    TONSILLECTOMY     Patient Active Problem List   Diagnosis Date Noted   Seasonal allergies    Hypothyroid     PCP: Onita Rush, MD   REFERRING PROVIDER: Eva Fallow, MD   REFERRING DIAG: M17.11 (ICD-10-CM) - Unilateral primary osteoarthritis, right knee [phone] (ICD-10-CM) - Presence of left artificial knee joint  THERAPY DIAG:  Acute pain of right knee  Muscle weakness (generalized)  Other abnormalities of gait  and mobility  Stiffness of right knee, not elsewhere classified  Localized edema  RATIONALE FOR EVALUATION AND TREATMENT: Rehabilitation  ONSET DATE: 01/18/24  NEXT MD VISIT: 1 month   SUBJECTIVE:  SUBJECTIVE STATEMENT: 59 y/o patient referred to PT following  R TKA by Dr Eva on 01/18/24.  Her surgery was done as an outpatient and she is post op day 3.    She states she has had the R knee pain for some time now and conservative management was just no longer working.   She has an ACE wrap holding a gauze bandage in place over the R knee incision.  Her orders are to remove this on post op day 3, so she is assisted with removal today in clinic.   Her incision is clean, dry, nondraining, well approximated with all staples in place.   The incision is covered with bordered gauze provided by MD/surgery center.  She reports her pain is significant, but controlled with meds.   She states she hasn't really been able to bend her knee too much since the ACE wrap dressing was in the way.   However, she has been trying to work on it as much as possible over the weekend.  She reports her bowels have moved after she got home.  She is using a front wheeled walker for all mobility and is brought to PT today by her sister who is assisting her with care.   Patient's baseline level of mobility is independent gait with no device.   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   She plan to return to work after her recovery is complete.   She is aware to keep her R knee clean/dry/covered/no submerging.   She is aware that her bordered gauze bandage is not waterproof and that she should not get it wet,  and if she does it must be changed.    PAIN: Are you having pain? Yes: NPRS scale: 5-6/10  now;  8/10 worst Pain location: R knee Pain description: aching constantly;  sharper at times with moving the RLE Aggravating factors: end range flexion and extension Relieving factors: rest, meds  PERTINENT HISTORY:  OA, hypthyroidism, GERD, autoimmune disorder of the skin (controlled)  PRECAUTIONS: Knee;  TKA precautions  RED FLAGS: None  WEIGHT BEARING RESTRICTIONS: No  FALLS:  Has patient fallen in last 6 months? 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 Usual hobbies, recreational or sporting activities 0  3. Getting into/out of the bath 2  4. Walking between rooms 3  5. Putting on socks/shoes 0  6. Squatting  0  7. Lifting an object, like a bag of groceries from the floor 4  8. Performing light activities around your home 3  9. Performing heavy activities around your home 2  10. Getting into/out of a car 0  11. Walking 2 blocks 0  12. Walking 1 mile 0  13. Going up/down 10 stairs (1 flight) 1  14. Standing for 1 hour 0  15.  sitting for 1 hour 0  16. Running on even ground  0  17. Running on uneven ground 0  18. Making sharp turns while running fast 0  19. Hopping  0  20. Rolling over in bed 2  Score total:  18/80     COGNITION: Overall cognitive status: Within functional limits for tasks assessed    SENSATION: WFL  EDEMA:   Circumferential: midpatellar:    RLE = cm;  LLE = cm  POSTURE:  rounded shoulders and forward head  PALPATION: Tender to palpate around the R knee in general.  No calf pain.   Homan's is negative  MUSCLE LENGTH:       LOWER EXTREMITY ROM:  Active ROM Right eval Left eval  Hip flexion    Hip extension    Hip abduction    Hip adduction    Hip internal rotation    Hip external rotation    Knee flexion 55 125  Knee extension -8 -2  Ankle dorsiflexion    Ankle plantarflexion    Ankle inversion    Ankle eversion     Passive ROM Right eval Left eval  Hip flexion    Hip extension    Hip abduction    Hip adduction    Hip internal rotation    Hip external rotation    Knee flexion    Knee extension    Ankle dorsiflexion    Ankle plantarflexion    Ankle inversion    Ankle eversion    (Blank rows = not tested)  LOWER EXTREMITY MMT:  MMT Right eval Left eval  Hip flexion 4- 5  Hip extension    Hip abduction    Hip adduction    Hip internal rotation    Hip external rotation    Knee flexion 4- 5  Knee extension 2+ 5  Ankle dorsiflexion 4 5  Ankle plantarflexion    Ankle inversion    Ankle eversion     (Blank rows = not tested)  FUNCTIONAL TESTS:  TBD  GAIT: Distance walked: into clinic x 120-140' Assistive device utilized: Environmental Consultant - 2 wheeled Level of assistance: SBA and CGA Gait pattern: decreased step length- Left, decreased stance time- Right, and antalgic Comments: slow, guarded stiff legged gait pattern RLE with minimal knee flexion in swing, moderate limp   TODAY'S TREATMENT:   SELF CARE: Provided education on PT POC progression, on post-surgical precautions, and on pain management options; initial HEP    PATIENT EDUCATION:  Education details: PT eval findings, anticipated POC, and initial HEP  Person educated: Patient Education method: Explanation, Demonstration, Verbal cues, Tactile cues, Handouts, and MedBridgeGO app access provided Education  comprehension: verbalized understanding, returned demonstration, verbal cues required, tactile cues required, and needs further education  HOME EXERCISE PROGRAM: Access Code: [email or web address] URL: [email or web address] Date: 01/21/2024 Prepared by: Garnette Montclair  Exercises - Supine Ankle Pumps  - 1 x daily - 7 x weekly - 3 sets - 10 reps - Supine Quad Set  - 1 x daily - 7 x weekly - 3 sets - 10 reps - Supine Heel Slides  - 1 x daily - 7 x weekly - 3 sets - 10 reps - Supine Short Arc Quad  - 1 x daily - 7 x weekly - 3 sets - 10 reps - Supine Hip Abduction  - 1 x daily - 7 x weekly - 1-2 sets - 10 reps - Supine Straight Leg Raises  - 1 x daily - 7 x weekly - 1-2 sets - 10 reps - Standing Ankle Dorsiflexion with  Table Support  - 1 x daily - 7 x weekly - 3 sets - 10 reps - Standing Heel Raises  - 1 x daily - 7 x weekly - 3 sets - 10 reps - Standing March with Counter Support  - 1 x daily - 7 x weekly - 3 sets - 10 reps - Standing Hip Abduction with Counter Support  - 1 x daily - 7 x weekly - 3 sets - 10 reps - Standing Hip Extension with Counter Support  - 1 x daily - 7 x weekly - 3 sets - 10 reps - Standing Knee Flexion with Counter Support  - 1 x daily - 7 x weekly - 3 sets - 10 reps - Seated Knee Extension Stretch with Chair  - 1 x daily - 7 x weekly - 1 sets - 3 reps - 3-5 min hold - Seated Knee Flexion Stretch  - 1 x daily - 7 x weekly - 1 sets - 2-3 reps - 1 min hold   ASSESSMENT:  CLINICAL IMPRESSION: Mandy Bond is a 59 y.o. female who was referred to physical therapy for evaluation and treatment for R TKA by Dr Eva on 01/18/24.   Patient reports moderate pain controlled with pain meds. Pain is worse with end range flexion and extension of the R knee.    She denies any calf pain and Homan's test is negative RLE.  She has moderate edema in the R knee.   Calf edema is mild.   She has TED hose that are placed today after ACE wrap is removed from the RLE.   She is  ambulating as expected for post op day 3 with her walker with antalgic, slow, stiff, step to gait pattern.      Patient has deficits in R knee ROM, RLE strength, HEP knowledge, safety, gait, balance, and pain which are interfering with ADLs and are impacting quality of life.  On LEFS patient scored 18/80 demonstrating severe functional limitation.  Mandy Bond will benefit from skilled PT to address above deficits to improve mobility and activity tolerance with decreased pain interference.  OBJECTIVE IMPAIRMENTS: difficulty walking, decreased ROM, decreased strength, increased edema, and pain.   ACTIVITY LIMITATIONS: carrying, lifting, bending, standing, squatting, sleeping, stairs, transfers, and locomotion level  PARTICIPATION LIMITATIONS: meal prep, cleaning, laundry, driving, shopping, and community activity  PERSONAL FACTORS: Age, Fitness, and 1-2 comorbidities: OA, hypthyroidism, GERD, autoimmune disorder of the skin (controlled) are also affecting patient's functional outcome.   REHAB POTENTIAL: Good  CLINICAL DECISION MAKING: Evolving/moderate complexity  EVALUATION COMPLEXITY: Moderate   GOALS: Goals reviewed with patient? Yes  SHORT TERM GOALS: Target date: 02/18/2024   Patient will be independent with initial HEP. Baseline: 100% PT assist required for correct completion Goal status: INITIAL  2.  Patient will report at least 25% improvement in R knee pain to improve QOL. Baseline: 8/10 Goal status: INITIAL  3.  Patient will ambulate independently with a cane indoors, outdoors Baseline: walker dependent with SBA/CGA Goal status: INITIAL   LONG TERM GOALS: Target date: 03/17/2024   Patient will be independent with advanced/ongoing HEP to improve outcomes and carryover.  Baseline: no advanced HEP yet Goal status: INITIAL  2.  Patient will report at least 50-75% improvement in R knee pain to improve QOL. Baseline: 8/10 Goal status: INITIAL  3.  Patient will demonstrate  improved R knee AROM to >/= 0-120 deg to allow for normal gait and stair mechanics. Baseline: Refer to above LE ROM table  Goal status: INITIAL  4.  Patient will demonstrate improved RLE strength to >/= 5/5 for improved stability and ease of mobility. Baseline: Refer to above LE MMT table Goal status: INITIAL  5.  Patient will be able to ambulate 20-30 min with no device and normal gait pattern without increased pain to access community.  Baseline: walker dependent for 150' with CGA/SBA Goal status: INITIAL  6. Patient will be able to ascend/descend stairs with 1 HR and reciprocal step pattern safely to access home and community.  Baseline: per patient report she negotiates 1 step at a time with 2 feet to a step with railing Goal status: INITIAL  7.  Patient will report >/= 50/80 on LEFS (MCID = 9 pts) to demonstrate improved functional ability. Baseline: 18/80 Goal status: INITIAL  8.  Patient will demonstrate at least 19/24 on DGI to decrease risk of falls. Baseline: TBD Goal status: INITIAL   PLAN:  PT FREQUENCY: 1-2x/week  PT DURATION: 8 weeks  PLANNED INTERVENTIONS: 97750- Physical Performance Testing, 97110-Therapeutic exercises, 97530- Therapeutic activity, [id number]- Neuromuscular re-education, 97535- Self Care, [id number]- Manual therapy, [phone]- Gait training, [phone]- Electrical stimulation (unattended), [id number]- Vasopneumatic device, 20560 (1-2 muscles), 20561 (3+ muscles)- Dry Needling, Patient/Family education, Balance training, Stair training, Taping, Joint mobilization, Cryotherapy, and Moist heat  PLAN FOR NEXT SESSION: review HEP;  progress R knee flexion and extension ROM, strength, vaso for edema   Caydn Justen, PT 01/21/2024, 8:25 PM  "

## 2024-01-18 ENCOUNTER — Ambulatory Visit

## 2024-01-21 ENCOUNTER — Encounter: Payer: Self-pay | Admitting: Rehabilitation

## 2024-01-21 ENCOUNTER — Ambulatory Visit: Attending: Orthopaedic Surgery | Admitting: Rehabilitation

## 2024-01-21 ENCOUNTER — Other Ambulatory Visit: Payer: Self-pay

## 2024-01-21 DIAGNOSIS — R600 Localized edema: Secondary | ICD-10-CM | POA: Insufficient documentation

## 2024-01-21 DIAGNOSIS — M25661 Stiffness of right knee, not elsewhere classified: Secondary | ICD-10-CM | POA: Diagnosis present

## 2024-01-21 DIAGNOSIS — M25561 Pain in right knee: Secondary | ICD-10-CM | POA: Insufficient documentation

## 2024-01-21 DIAGNOSIS — R2689 Other abnormalities of gait and mobility: Secondary | ICD-10-CM | POA: Insufficient documentation

## 2024-01-21 DIAGNOSIS — M6281 Muscle weakness (generalized): Secondary | ICD-10-CM | POA: Insufficient documentation

## 2024-01-23 ENCOUNTER — Ambulatory Visit

## 2024-01-23 DIAGNOSIS — M25561 Pain in right knee: Secondary | ICD-10-CM

## 2024-01-23 DIAGNOSIS — M6281 Muscle weakness (generalized): Secondary | ICD-10-CM

## 2024-01-23 DIAGNOSIS — R2689 Other abnormalities of gait and mobility: Secondary | ICD-10-CM

## 2024-01-23 DIAGNOSIS — M25661 Stiffness of right knee, not elsewhere classified: Secondary | ICD-10-CM

## 2024-01-23 DIAGNOSIS — R600 Localized edema: Secondary | ICD-10-CM

## 2024-01-23 NOTE — Therapy (Signed)
 " OUTPATIENT PHYSICAL THERAPY LOWER EXTREMITY TREATMENT   Patient Name: Mandy Bond MRN: [id number] DOB:Sep 22, 1965, 59 y.o., female Today's Date: 01/23/2024   END OF SESSION:  PT End of Session - 01/23/24 1015     Visit Number 2    Date for Recertification  03/17/24    PT Start Time 0930    PT Stop Time 1025    PT Time Calculation (min) 55 min    Activity Tolerance Patient tolerated treatment well    Behavior During Therapy [facility] for tasks assessed/performed           Past Medical History:  Diagnosis Date   AGUS favor benign 2007   w/u suggested endometriosis of cervix   Allergy    Arthritis    knees   Endometriosis 12/18/2007   STAGE 4--TAH, BSO   GERD (gastroesophageal reflux disease)    Hypothyroid    pt denies- no medication for this    Inflammatory autoimmune disorder of skin    controlled   Seasonal allergies    Ulcer 2005-2006   past hx long ago per pt.   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, 11/2012   negative high-risk HPV  on vaginal biopsy    Past Surgical History:  Procedure Laterality Date   ABDOMINAL HYSTERECTOMY  12/2007   TAH,BSO   ANKLE SURGERY Right    CHOLECYSTECTOMY     COLONOSCOPY  2017   DILATION AND CURETTAGE OF UTERUS     HYSTEROSCOPY  12/2000, 08/2005   AND D&C   KNEE SURGERY Bilateral    scopes   NEURECTOMY FOOT     OOPHORECTOMY     BSO   PELVIC LAPAROSCOPY     DIAG LAP/STAGE 4 ENDOMETRIOSIS   POLYPECTOMY     2017   REPLACEMENT TOTAL KNEE Left    TONSILLECTOMY     Patient Active Problem List   Diagnosis Date Noted   Seasonal allergies    Hypothyroid     PCP: Onita Rush, MD   REFERRING PROVIDER: Eva Fallow, MD   REFERRING DIAG: M17.11 (ICD-10-CM) - Unilateral primary osteoarthritis, right knee [phone] (ICD-10-CM) - Presence of left artificial knee joint  THERAPY DIAG:  Acute pain of right knee  Muscle weakness (generalized)  Other abnormalities of gait and mobility  Stiffness  of right knee, not elsewhere classified  Localized edema  RATIONALE FOR EVALUATION AND TREATMENT: Rehabilitation  ONSET DATE: 01/18/24  NEXT MD VISIT: 1 month   SUBJECTIVE:  SUBJECTIVE STATEMENT: Pt reports her sister keeping up with her medications, she took some before her appointment.   59 y/o patient referred to PT following  R TKA by Dr Eva on 01/18/24.  Her surgery was done as an outpatient and she is post op day 3.    She states she has had the R knee pain for some time now and conservative management was just no longer working.   She has an ACE wrap holding a gauze bandage in place over the R knee incision.  Her orders are to remove this on post op day 3, so she is assisted with removal today in clinic.   Her incision is clean, dry, nondraining, well approximated with all staples in place.   The incision is covered with bordered gauze provided by MD/surgery center.  She reports her pain is significant, but controlled with meds.   She states she hasn't really been able to bend her knee too much since the ACE wrap dressing was in the way.   However, she has been trying to work on it as much as possible over the weekend.  She reports her bowels have moved after she got home.  She is using a front wheeled walker for all mobility and is brought to PT today by her sister who is assisting her with care.   Patient's baseline level of mobility is independent gait with no device.   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   She plan to return to work after her recovery is complete.   She is aware to keep her R knee clean/dry/covered/no submerging.   She is aware that her bordered gauze bandage is not waterproof and that she should not get it wet,  and if she does it  must be changed.    PAIN: Are you having pain? Yes: NPRS scale: 10/10 earlier this morning;  8/10 worst Pain location: R knee Pain description: aching constantly;  sharper at times with moving the RLE Aggravating factors: end range flexion and extension Relieving factors: rest, meds  PERTINENT HISTORY:  OA, hypthyroidism, GERD, autoimmune disorder of the skin (controlled)  PRECAUTIONS: Knee;  TKA precautions  RED FLAGS: None  WEIGHT BEARING RESTRICTIONS: No  FALLS:  Has patient fallen in last 6 months? 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 Usual hobbies, recreational or sporting activities 0  3. Getting into/out of the bath 2  4. Walking between rooms 3  5. Putting on socks/shoes 0  6. Squatting  0  7. Lifting an object, like a bag of groceries from the floor 4  8. Performing light activities around your home 3  9. Performing heavy activities around your home 2  10. Getting into/out of a car 0  11. Walking 2 blocks 0  12. Walking 1 mile 0  13. Going up/down 10 stairs (1 flight) 1  14.  Standing for 1 hour 0  15.  sitting for 1 hour 0  16. Running on even ground 0  17. Running on uneven ground 0  18. Making sharp turns while running fast 0  19. Hopping  0  20. Rolling over in bed 2  Score total:  18/80     COGNITION: Overall cognitive  status: Within functional limits for tasks assessed    SENSATION: WFL  EDEMA:  Circumferential: midpatellar:    RLE = cm;  LLE = cm  POSTURE:  rounded shoulders and forward head  PALPATION: Tender to palpate around the R knee in general.  No calf pain.   Homan's is negative  MUSCLE LENGTH:       LOWER EXTREMITY ROM:  Active ROM Right eval Left eval  Hip flexion    Hip extension    Hip abduction    Hip adduction    Hip internal rotation    Hip external rotation    Knee flexion 55 125  Knee extension -8 -2  Ankle dorsiflexion    Ankle plantarflexion    Ankle inversion    Ankle eversion     Passive ROM Right eval Left eval  Hip flexion    Hip extension    Hip abduction    Hip adduction    Hip internal rotation    Hip external rotation    Knee flexion    Knee extension    Ankle dorsiflexion    Ankle plantarflexion    Ankle inversion    Ankle eversion    (Blank rows = not tested)  LOWER EXTREMITY MMT:  MMT Right eval Left eval  Hip flexion 4- 5  Hip extension    Hip abduction    Hip adduction    Hip internal rotation    Hip external rotation    Knee flexion 4- 5  Knee extension 2+ 5  Ankle dorsiflexion 4 5  Ankle plantarflexion    Ankle inversion    Ankle eversion     (Blank rows = not tested)  FUNCTIONAL TESTS:  TBD  GAIT: Distance walked: into clinic x 120-140' Assistive device utilized: Environmental Consultant - 2 wheeled Level of assistance: SBA and CGA Gait pattern: decreased step length- Left, decreased stance time- Right, and antalgic Comments: slow, guarded stiff legged gait pattern RLE with minimal knee flexion in swing, moderate limp   TODAY'S TREATMENT:  01/23/24 Nustep L2x62min UE/LE Seated R heel slides 2 x 10 Longsitting PF RTB x 20 Longsitting R hamstring stretch x 1 min Gait around clinic 180 ft with RW - decreased R knee flexion on swing Supine Quad sets with towel under knee x 20 Supine heel slides with strap 2x10  Standing with UE  support on walker: Standing heel raises x 20 BLE Standing toe raise x 20 BLE Standing R HS curls x 10  Standing hip ABD x 10  Standing hip extension x 10 RLE Standing R knee flexion  x 20  Vaso 10 min to R knee mod compression   Eval SELF CARE: Provided education on PT POC progression, on post-surgical precautions, and on pain management options; initial HEP    PATIENT EDUCATION:  Education details: PT eval findings, anticipated POC, and initial HEP  Person educated: Patient Education method: Explanation, Demonstration, Verbal cues, Tactile cues, Handouts, and MedBridgeGO app access provided Education comprehension: verbalized understanding, returned demonstration, verbal cues required, tactile cues required, and needs further education  HOME EXERCISE PROGRAM: Access Code: [email or web address] URL: [email or web address] Date: 01/21/2024  Prepared by: Garnette Montclair  Exercises - Supine Ankle Pumps  - 1 x daily - 7 x weekly - 3 sets - 10 reps - Supine Quad Set  - 1 x daily - 7 x weekly - 3 sets - 10 reps - Supine Heel Slides  - 1 x daily - 7 x weekly - 3 sets - 10 reps - Supine Short Arc Quad  - 1 x daily - 7 x weekly - 3 sets - 10 reps - Supine Hip Abduction  - 1 x daily - 7 x weekly - 1-2 sets - 10 reps - Supine Straight Leg Raises  - 1 x daily - 7 x weekly - 1-2 sets - 10 reps - Standing Ankle Dorsiflexion with Table Support  - 1 x daily - 7 x weekly - 3 sets - 10 reps - Standing Heel Raises  - 1 x daily - 7 x weekly - 3 sets - 10 reps - Standing March with Counter Support  - 1 x daily - 7 x weekly - 3 sets - 10 reps - Standing Hip Abduction with Counter Support  - 1 x daily - 7 x weekly - 3 sets - 10 reps - Standing Hip Extension with Counter Support  - 1 x daily - 7 x weekly - 3 sets - 10 reps - Standing Knee Flexion with Counter Support  - 1 x daily - 7 x weekly - 3 sets - 10 reps - Seated Knee Extension Stretch with Chair  - 1 x daily - 7 x weekly - 1 sets - 3 reps -  3-5 min hold - Seated Knee Flexion Stretch  - 1 x daily - 7 x weekly - 1 sets - 2-3 reps - 1 min hold   ASSESSMENT:  CLINICAL IMPRESSION: Pt was able to complete session with no issues. Progressed through ROM exercises and strength for her RLE. Cues provided throughout for correct form and technique. Concluded session with vaso to address edema.    Mandy Bond is a 59 y.o. female who was referred to physical therapy for evaluation and treatment for R TKA by Dr Eva on 01/18/24.   Patient reports moderate pain controlled with pain meds. Pain is worse with end range flexion and extension of the R knee.    She denies any calf pain and Homan's test is negative RLE.  She has moderate edema in the R knee.   Calf edema is mild.   She has TED hose that are placed today after ACE wrap is removed from the RLE.   She is ambulating as expected for post op day 3 with her walker with antalgic, slow, stiff, step to gait pattern.      Patient has deficits in R knee ROM, RLE strength, HEP knowledge, safety, gait, balance, and pain which are interfering with ADLs and are impacting quality of life.  On LEFS patient scored 18/80 demonstrating severe functional limitation.  Mandy Bond will benefit from skilled PT to address above deficits to improve mobility and activity tolerance with decreased pain interference.  OBJECTIVE IMPAIRMENTS: difficulty walking, decreased ROM, decreased strength, increased edema, and pain.   ACTIVITY LIMITATIONS: carrying, lifting, bending, standing, squatting, sleeping, stairs, transfers, and locomotion level  PARTICIPATION LIMITATIONS: meal prep, cleaning, laundry, driving, shopping, and community activity  PERSONAL FACTORS: Age, Fitness, and 1-2 comorbidities: OA, hypthyroidism, GERD, autoimmune disorder of the skin (controlled) are also affecting patient's functional outcome.   REHAB POTENTIAL: Good  CLINICAL DECISION MAKING: Evolving/moderate complexity  EVALUATION  COMPLEXITY: Moderate   GOALS: Goals reviewed with patient? Yes  SHORT TERM GOALS: Target date: 02/18/2024   Patient will be independent with initial HEP. Baseline: 100% PT assist required for correct completion Goal status: INITIAL  2.  Patient will report at least 25% improvement in R knee pain to improve QOL. Baseline: 8/10 Goal status: INITIAL  3.  Patient will ambulate independently with a cane indoors, outdoors Baseline: walker dependent with SBA/CGA Goal status: INITIAL   LONG TERM GOALS: Target date: 03/17/2024   Patient will be independent with advanced/ongoing HEP to improve outcomes and carryover.  Baseline: no advanced HEP yet Goal status: INITIAL  2.  Patient will report at least 50-75% improvement in R knee pain to improve QOL. Baseline: 8/10 Goal status: INITIAL  3.  Patient will demonstrate improved R knee AROM to >/= 0-120 deg to allow for normal gait and stair mechanics. Baseline: Refer to above LE ROM table Goal status: INITIAL  4.  Patient will demonstrate improved RLE strength to >/= 5/5 for improved stability and ease of mobility. Baseline: Refer to above LE MMT table Goal status: INITIAL  5.  Patient will be able to ambulate 20-30 min with no device and normal gait pattern without increased pain to access community.  Baseline: walker dependent for 150' with CGA/SBA Goal status: INITIAL  6. Patient will be able to ascend/descend stairs with 1 HR and reciprocal step pattern safely to access home and community.  Baseline: per patient report she negotiates 1 step at a time with 2 feet to a step with railing Goal status: INITIAL  7.  Patient will report >/= 50/80 on LEFS (MCID = 9 pts) to demonstrate improved functional ability. Baseline: 18/80 Goal status: INITIAL  8.  Patient will demonstrate at least 19/24 on DGI to decrease risk of falls. Baseline: TBD Goal status: INITIAL   PLAN:  PT FREQUENCY: 1-2x/week  PT DURATION: 8 weeks  PLANNED  INTERVENTIONS: 97750- Physical Performance Testing, 97110-Therapeutic exercises, 97530- Therapeutic activity, [id number]- Neuromuscular re-education, 97535- Self Care, [id number]- Manual therapy, [phone]- Gait training, [phone]- Electrical stimulation (unattended), [id number]- Vasopneumatic device, 20560 (1-2 muscles), 20561 (3+ muscles)- Dry Needling, Patient/Family education, Balance training, Stair training, Taping, Joint mobilization, Cryotherapy, and Moist heat  PLAN FOR NEXT SESSION: progress R knee flexion and extension ROM, strength, vaso for edema   Sol LITTIE Gaskins, PTA 01/23/2024, 10:15 AM  "

## 2024-01-25 ENCOUNTER — Ambulatory Visit: Admitting: Rehabilitation

## 2024-01-25 ENCOUNTER — Encounter: Payer: Self-pay | Admitting: Rehabilitation

## 2024-01-25 DIAGNOSIS — M25561 Pain in right knee: Secondary | ICD-10-CM

## 2024-01-25 DIAGNOSIS — R2689 Other abnormalities of gait and mobility: Secondary | ICD-10-CM

## 2024-01-25 DIAGNOSIS — M6281 Muscle weakness (generalized): Secondary | ICD-10-CM

## 2024-01-25 DIAGNOSIS — M25661 Stiffness of right knee, not elsewhere classified: Secondary | ICD-10-CM

## 2024-01-25 NOTE — Therapy (Signed)
 " OUTPATIENT PHYSICAL THERAPY LOWER EXTREMITY TREATMENT   Patient Name: CHAMIKA CUNANAN MRN: [id number] DOB:[birth year], 59 y.o., female Today's Date: 01/25/2024   END OF SESSION:  PT End of Session - 01/25/24 0932     Visit Number 3    Date for Recertification  03/17/24    PT Start Time 0930    PT Stop Time 1032    PT Time Calculation (min) 62 min    Activity Tolerance Patient tolerated treatment well    Behavior During Therapy [facility] for tasks assessed/performed           Past Medical History:  Diagnosis Date   AGUS favor benign 2007   w/u suggested endometriosis of cervix   Allergy    Arthritis    knees   Endometriosis 12/18/2007   STAGE 4--TAH, BSO   GERD (gastroesophageal reflux disease)    Hypothyroid    pt denies- no medication for this    Inflammatory autoimmune disorder of skin    controlled   Seasonal allergies    Ulcer 2005-2006   past hx long ago per pt.   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, 11/2012   negative high-risk HPV  on vaginal biopsy    Past Surgical History:  Procedure Laterality Date   ABDOMINAL HYSTERECTOMY  12/2007   TAH,BSO   ANKLE SURGERY Right    CHOLECYSTECTOMY     COLONOSCOPY  2017   DILATION AND CURETTAGE OF UTERUS     HYSTEROSCOPY  12/2000, 08/2005   AND D&C   KNEE SURGERY Bilateral    scopes   NEURECTOMY FOOT     OOPHORECTOMY     BSO   PELVIC LAPAROSCOPY     DIAG LAP/STAGE 4 ENDOMETRIOSIS   POLYPECTOMY     2017   REPLACEMENT TOTAL KNEE Left    TONSILLECTOMY     Patient Active Problem List   Diagnosis Date Noted   Seasonal allergies    Hypothyroid     PCP: Onita Rush, MD   REFERRING PROVIDER: Eva Fallow, MD   REFERRING DIAG: M17.11 (ICD-10-CM) - Unilateral primary osteoarthritis, right knee [phone] (ICD-10-CM) - Presence of left artificial knee joint  THERAPY DIAG:  Acute pain of right knee  Muscle weakness (generalized)  Other abnormalities of gait and mobility  Stiffness  of right knee, not elsewhere classified  RATIONALE FOR EVALUATION AND TREATMENT: Rehabilitation  ONSET DATE: 01/18/24  NEXT MD VISIT: 1 month   SUBJECTIVE:  SUBJECTIVE STATEMENT: Patient states feel ok.  Rates pain today 6/10 in the R knee.   States bowels are moving.   She has changed her bordered gauze bandage since last visit with no difficulty.   Denies any redness or drainage from her incision.  Denies getting the incision wet and knows that the bandage isn't waterproof and must be covered in the shower.  59 y/o patient referred to PT following  R TKA by Dr Eva on 01/18/24.  Her surgery was done as an outpatient and she is post op day 3.    She states she has had the R knee pain for some time now and conservative management was just no longer working.   She has an ACE wrap holding a gauze bandage in place over the R knee incision.  Her orders are to remove this on post op day 3, so she is assisted with removal today in clinic.   Her incision is clean, dry, nondraining, well approximated with all staples in place.   The incision is covered with bordered gauze provided by MD/surgery center.  She reports her pain is significant, but controlled with meds.   She states she hasn't really been able to bend her knee too much since the ACE wrap dressing was in the way.   However, she has been trying to work on it as much as possible over the weekend.  She reports her bowels have moved after she got home.  She is using a front wheeled walker for all mobility and is brought to PT today by her sister who is assisting her with care.   Patient's baseline level of mobility is independent gait with no device.   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   She plan to return to work  after her recovery is complete.   She is aware to keep her R knee clean/dry/covered/no submerging.   She is aware that her bordered gauze bandage is not waterproof and that she should not get it wet,  and if she does it must be changed.    PAIN: Are you having pain? Yes: NPRS scale: 10/10 earlier this morning;  8/10 worst Pain location: R knee Pain description: aching constantly;  sharper at times with moving the RLE Aggravating factors: end range flexion and extension Relieving factors: rest, meds  PERTINENT HISTORY:  OA, hypthyroidism, GERD, autoimmune disorder of the skin (controlled)  PRECAUTIONS: Knee;  TKA precautions  RED FLAGS: None  WEIGHT BEARING RESTRICTIONS: No  FALLS:  Has patient fallen in last 6 months? 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 Usual hobbies, recreational or sporting activities 0  3. Getting into/out of the bath 2  4. Walking between rooms 3  5. Putting on socks/shoes 0  6. Squatting  0  7. Lifting an object, like a bag of groceries from the floor 4  8. Performing light activities around your home 3  9. Performing heavy activities around your home 2  10. Getting into/out of a car 0  11. Walking 2 blocks 0  12. Walking 1 mile 0  13. Going up/down 10 stairs (1 flight) 1  14. Standing for 1 hour 0  15.  sitting for 1 hour 0   16. Running on even ground 0  17. Running on uneven ground 0  18. Making sharp turns while running fast 0  19. Hopping  0  20. Rolling over in bed 2  Score total:  18/80     COGNITION: Overall cognitive status: Within functional limits for tasks assessed    SENSATION: WFL  EDEMA:  Circumferential: midpatellar:    RLE = cm;  LLE = cm  POSTURE:  rounded shoulders and forward head  PALPATION: Tender to palpate around the R knee in general.  No calf pain.   Homan's is negative  MUSCLE LENGTH:       LOWER EXTREMITY ROM:  Active ROM Right eval Left eval  Hip flexion    Hip extension    Hip abduction    Hip adduction    Hip internal rotation    Hip external rotation    Knee flexion 55 125  Knee extension -8 -2  Ankle dorsiflexion    Ankle plantarflexion    Ankle inversion    Ankle eversion     Passive ROM Right eval Left eval  Hip flexion    Hip extension    Hip abduction    Hip adduction    Hip internal rotation    Hip external rotation    Knee flexion    Knee extension    Ankle dorsiflexion    Ankle plantarflexion    Ankle inversion    Ankle eversion    (Blank rows = not tested)  LOWER EXTREMITY MMT:  MMT Right eval Left eval  Hip flexion 4- 5  Hip extension    Hip abduction    Hip adduction    Hip internal rotation    Hip external rotation    Knee flexion 4- 5  Knee extension 2+ 5  Ankle dorsiflexion 4 5  Ankle plantarflexion    Ankle inversion    Ankle eversion     (Blank rows = not tested)  FUNCTIONAL TESTS:  TBD  GAIT: Distance walked: into clinic x 120-140' Assistive device utilized: Environmental Consultant - 2 wheeled Level of assistance: SBA and CGA Gait pattern: decreased step length- Left, decreased stance time- Right, and antalgic Comments: slow, guarded stiff legged gait pattern RLE with minimal knee flexion in swing, moderate limp   TODAY'S TREATMENT:  01/24/24 THERAPEUTIC EXERCISE: To improve strength, endurance, and ROM.  Demonstration,  verbal and tactile cues throughout for technique. NuStep L1 x 6'  THERAPEUTIC ACTIVITIES: To improve functional performance.  Demonstration, verbal and tactile cues throughout for technique. Standing:  Toe raises no UE support x 20 BLE  Heel raises no UE support x 20 BLE  Minisquats x 10 BLE  Marching 2# x 15 BLE  Hip ABD 2# x 20 RLE  Hip ext 2# x 20 RLE  Knee flexion 2# x 20 RLE Sit n slide flexion stretch x 1' x 3 RLE Supine:   QS x 20 RLE SLR x 20 RLE HS x 20 RLE SAQ x 20 RLE Hip flexor stretch leg off bed x 1' RLE SKTC knee flexion stretch x 1' x 2 RLE  NEUROMUSCULAR RE-EDUCATION: To  improve strength, coordination, and balance.  F/B gait at counter with 1UE support x 5 laps Sidestepping at counter x 5 laps  MODALITIES: Vasopneumatic ice x 15' min to R knee at lightest compression x 34 deg for edema  01/23/24 Nustep L2x54min UE/LE Seated R heel slides 2 x 10 Longsitting PF RTB x 20 Longsitting R hamstring stretch x 1 min Gait around clinic 180 ft with RW - decreased R knee flexion on swing Supine Quad sets with towel under knee x 20 Supine heel slides with strap 2x10  Standing with UE support on walker: Standing heel raises x 20 BLE Standing toe raise x 20 BLE Standing R HS curls x 10  Standing hip ABD x 10  Standing hip extension x 10 RLE Standing R knee flexion  x 20  Vaso 10 min to R knee mod compression   Eval SELF CARE: Provided education on PT POC progression, on post-surgical precautions, and on pain management options; initial HEP    PATIENT EDUCATION:  Education details: HEP review and adding SKTC flexion stretch to HEP  Person educated: Patient Education method: Explanation, Demonstration, Verbal cues, Tactile cues, Handouts, and MedBridgeGO app access provided Education comprehension: verbalized understanding, returned demonstration, verbal cues required, tactile cues required, and needs further education  HOME EXERCISE PROGRAM: Access Code:  [email or web address] URL: [email or web address] Date: 01/21/2024 Prepared by: Garnette Montclair  Exercises - Supine Ankle Pumps  - 1 x daily - 7 x weekly - 3 sets - 10 reps - Supine Quad Set  - 1 x daily - 7 x weekly - 3 sets - 10 reps - Supine Heel Slides  - 1 x daily - 7 x weekly - 3 sets - 10 reps - Supine Short Arc Quad  - 1 x daily - 7 x weekly - 3 sets - 10 reps - Supine Hip Abduction  - 1 x daily - 7 x weekly - 1-2 sets - 10 reps - Supine Straight Leg Raises  - 1 x daily - 7 x weekly - 1-2 sets - 10 reps - Standing Ankle Dorsiflexion with Table Support  - 1 x daily - 7 x weekly - 3 sets - 10 reps - Standing Heel Raises  - 1 x daily - 7 x weekly - 3 sets - 10 reps - Standing March with Counter Support  - 1 x daily - 7 x weekly - 3 sets - 10 reps - Standing Hip Abduction with Counter Support  - 1 x daily - 7 x weekly - 3 sets - 10 reps - Standing Hip Extension with Counter Support  - 1 x daily - 7 x weekly - 3 sets - 10 reps - Standing Knee Flexion with Counter Support  - 1 x daily - 7 x weekly - 3 sets - 10 reps - Seated Knee Extension Stretch with Chair  - 1 x daily - 7 x weekly - 1 sets - 3 reps - 3-5 min hold - Seated Knee Flexion Stretch  - 1 x daily - 7 x weekly - 1 sets - 2-3 reps - 1 min hold   ASSESSMENT:  CLINICAL IMPRESSION: Patient does well today with PT.  She gets better knee flexion in supine with single knee to chest stretch w/ PT assist for holding her foot.   She is advised to work on this at home with caregiver assisting.  She is able to progress to single UE supported gait at counter.  Should be ready to progress to indoor gait  with cane next visit/week.  Edema is still moderate in the R knee and vasopneumatic ice is still needed for this.  [place]' is negative RLE.  She reports bowels are moving.  PT remains necessary for strength, ROM, balance, gait, HEP deficits.   Continue per POC  NORENE OLIVERI is a 59 y.o. female who was referred to physical therapy for  evaluation and treatment for R TKA by Dr Eva on 01/18/24.   Patient reports moderate pain controlled with pain meds. Pain is worse with end range flexion and extension of the R knee.    She denies any calf pain and Homan's test is negative RLE.  She has moderate edema in the R knee.   Calf edema is mild.   She has TED hose that are placed today after ACE wrap is removed from the RLE.   She is ambulating as expected for post op day 3 with her walker with antalgic, slow, stiff, step to gait pattern.      Patient has deficits in R knee ROM, RLE strength, HEP knowledge, safety, gait, balance, and pain which are interfering with ADLs and are impacting quality of life.  On LEFS patient scored 18/80 demonstrating severe functional limitation.  Caraline will benefit from skilled PT to address above deficits to improve mobility and activity tolerance with decreased pain interference.  OBJECTIVE IMPAIRMENTS: difficulty walking, decreased ROM, decreased strength, increased edema, and pain.   ACTIVITY LIMITATIONS: carrying, lifting, bending, standing, squatting, sleeping, stairs, transfers, and locomotion level  PARTICIPATION LIMITATIONS: meal prep, cleaning, laundry, driving, shopping, and community activity  PERSONAL FACTORS: Age, Fitness, and 1-2 comorbidities: OA, hypthyroidism, GERD, autoimmune disorder of the skin (controlled) are also affecting patient's functional outcome.   REHAB POTENTIAL: Good  CLINICAL DECISION MAKING: Evolving/moderate complexity  EVALUATION COMPLEXITY: Moderate   GOALS: Goals reviewed with patient? Yes  SHORT TERM GOALS: Target date: 02/18/2024   Patient will be independent with initial HEP. Baseline: 100% PT assist required for correct completion 01/25/24:  some assist required Goal status: IN PROGRESS  2.  Patient will report at least 25% improvement in R knee pain to improve QOL. Baseline: 8/10 01/25/24:  6/10 Goal status: IN PROGRESS  3.  Patient will ambulate  independently with a cane indoors, outdoors Baseline: walker dependent with SBA/CGA 01/25/24:  single UE support at counter with CGA Goal status: IN PROGRESS   LONG TERM GOALS: Target date: 03/17/2024   Patient will be independent with advanced/ongoing HEP to improve outcomes and carryover.  Baseline: no advanced HEP yet Goal status: INITIAL  2.  Patient will report at least 50-75% improvement in R knee pain to improve QOL. Baseline: 8/10 Goal status: INITIAL  3.  Patient will demonstrate improved R knee AROM to >/= 0-120 deg to allow for normal gait and stair mechanics. Baseline: Refer to above LE ROM table Goal status: INITIAL  4.  Patient will demonstrate improved RLE strength to >/= 5/5 for improved stability and ease of mobility. Baseline: Refer to above LE MMT table Goal status: INITIAL  5.  Patient will be able to ambulate 20-30 min with no device and normal gait pattern without increased pain to access community.  Baseline: walker dependent for 150' with CGA/SBA Goal status: INITIAL  6. Patient will be able to ascend/descend stairs with 1 HR and reciprocal step pattern safely to access home and community.  Baseline: per patient report she negotiates 1 step at a time with 2 feet to a step with railing Goal  status: INITIAL  7.  Patient will report >/= 50/80 on LEFS (MCID = 9 pts) to demonstrate improved functional ability. Baseline: 18/80 Goal status: INITIAL  8.  Patient will demonstrate at least 19/24 on DGI to decrease risk of falls. Baseline: TBD Goal status: INITIAL   PLAN:  PT FREQUENCY: 1-2x/week  PT DURATION: 8 weeks  PLANNED INTERVENTIONS: 97750- Physical Performance Testing, 97110-Therapeutic exercises, 97530- Therapeutic activity, [id number]- Neuromuscular re-education, 97535- Self Care, [id number]- Manual therapy, [phone]- Gait training, [phone]- Electrical stimulation (unattended), [id number]- Vasopneumatic device, [phone] (1-2 muscles), 20561 (3+ muscles)- Dry Needling,  Patient/Family education, Balance training, Stair training, Taping, Joint mobilization, Cryotherapy, and Moist heat  PLAN FOR NEXT SESSION: check knee circumference;  gait with SPC; progress R knee flexion and extension ROM, strength, vaso for edema   Jacqueli Pangallo, PT 01/25/2024, 10:40 PM  "

## 2024-01-28 ENCOUNTER — Ambulatory Visit

## 2024-01-30 ENCOUNTER — Ambulatory Visit

## 2024-01-30 DIAGNOSIS — R2689 Other abnormalities of gait and mobility: Secondary | ICD-10-CM

## 2024-01-30 DIAGNOSIS — M25561 Pain in right knee: Secondary | ICD-10-CM | POA: Diagnosis not present

## 2024-01-30 DIAGNOSIS — M25661 Stiffness of right knee, not elsewhere classified: Secondary | ICD-10-CM

## 2024-01-30 DIAGNOSIS — M6281 Muscle weakness (generalized): Secondary | ICD-10-CM

## 2024-01-30 DIAGNOSIS — R600 Localized edema: Secondary | ICD-10-CM

## 2024-01-30 NOTE — Therapy (Signed)
 " OUTPATIENT PHYSICAL THERAPY LOWER EXTREMITY TREATMENT   Patient Name: Mandy Bond MRN: [id number] DOB:[birth year], 59 y.o., female Today's Date: 01/30/2024   END OF SESSION:  PT End of Session - 01/30/24 1325     Visit Number 4    Date for Recertification  03/17/24    PT Start Time 1319    PT Stop Time 1418    PT Time Calculation (min) 59 min    Activity Tolerance Patient tolerated treatment well    Behavior During Therapy [facility] for tasks assessed/performed            Past Medical History:  Diagnosis Date   AGUS favor benign 2007   w/u suggested endometriosis of cervix   Allergy    Arthritis    knees   Endometriosis 12/18/2007   STAGE 4--TAH, BSO   GERD (gastroesophageal reflux disease)    Hypothyroid    pt denies- no medication for this    Inflammatory autoimmune disorder of skin    controlled   Seasonal allergies    Ulcer 2005-2006   past hx long ago per pt.   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, 11/2012   negative high-risk HPV  on vaginal biopsy    Past Surgical History:  Procedure Laterality Date   ABDOMINAL HYSTERECTOMY  12/2007   TAH,BSO   ANKLE SURGERY Right    CHOLECYSTECTOMY     COLONOSCOPY  2017   DILATION AND CURETTAGE OF UTERUS     HYSTEROSCOPY  12/2000, 08/2005   AND D&C   KNEE SURGERY Bilateral    scopes   NEURECTOMY FOOT     OOPHORECTOMY     BSO   PELVIC LAPAROSCOPY     DIAG LAP/STAGE 4 ENDOMETRIOSIS   POLYPECTOMY     2017   REPLACEMENT TOTAL KNEE Left    TONSILLECTOMY     Patient Active Problem List   Diagnosis Date Noted   Seasonal allergies    Hypothyroid     PCP: Onita Rush, MD   REFERRING PROVIDER: Eva Fallow, MD   REFERRING DIAG: M17.11 (ICD-10-CM) - Unilateral primary osteoarthritis, right knee [phone] (ICD-10-CM) - Presence of left artificial knee joint  THERAPY DIAG:  Acute pain of right knee  Muscle weakness (generalized)  Other abnormalities of gait and  mobility  Stiffness of right knee, not elsewhere classified  Localized edema  RATIONALE FOR EVALUATION AND TREATMENT: Rehabilitation  ONSET DATE: 01/18/24  NEXT MD VISIT: 02/01/24   SUBJECTIVE:  SUBJECTIVE STATEMENT: Pt reports pain level 5/10 in her knee, brought in cane today  59 y/o patient referred to PT following  R TKA by Dr Eva on 01/18/24.  Her surgery was done as an outpatient and she is post op day 3.    She states she has had the R knee pain for some time now and conservative management was just no longer working.   She has an ACE wrap holding a gauze bandage in place over the R knee incision.  Her orders are to remove this on post op day 3, so she is assisted with removal today in clinic.   Her incision is clean, dry, nondraining, well approximated with all staples in place.   The incision is covered with bordered gauze provided by MD/surgery center.  She reports her pain is significant, but controlled with meds.   She states she hasn't really been able to bend her knee too much since the ACE wrap dressing was in the way.   However, she has been trying to work on it as much as possible over the weekend.  She reports her bowels have moved after she got home.  She is using a front wheeled walker for all mobility and is brought to PT today by her sister who is assisting her with care.   Patient's baseline level of mobility is independent gait with no device.   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   She plan to return to work after her recovery is complete.   She is aware to keep her R knee clean/dry/covered/no submerging.   She is aware that her bordered gauze bandage is not waterproof and that she should not get it wet,  and if she does it must be  changed.    PAIN: Are you having pain? Yes: NPRS scale:   5/10 worst Pain location: R knee Pain description: aching constantly;  sharper at times with moving the RLE Aggravating factors: end range flexion and extension Relieving factors: rest, meds  PERTINENT HISTORY:  OA, hypthyroidism, GERD, autoimmune disorder of the skin (controlled)  PRECAUTIONS: Knee;  TKA precautions  RED FLAGS: None  WEIGHT BEARING RESTRICTIONS: No  FALLS:  Has patient fallen in last 6 months? 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 Usual hobbies, recreational or sporting activities 0  3. Getting into/out of the bath 2  4. Walking between rooms 3  5. Putting on socks/shoes 0  6. Squatting  0  7. Lifting an object, like a bag of groceries from the floor 4  8. Performing light activities around your home 3  9. Performing heavy activities around your home 2  10. Getting into/out of a car 0  11. Walking 2 blocks 0  12. Walking 1 mile 0  13. Going up/down 10 stairs (1 flight) 1  14. Standing for 1 hour 0  15.  sitting for 1 hour 0  16. Running on even ground 0  17. Running on uneven ground 0  18. Making sharp turns while running fast 0  19. Hopping  0  20. Rolling over in bed 2  Score total:  18/80     COGNITION: Overall cognitive status: Within functional limits for  tasks assessed    SENSATION: WFL  EDEMA:  Circumferential: midpatellar:    RLE = cm;  LLE = cm  POSTURE:  rounded shoulders and forward head  PALPATION: Tender to palpate around the R knee in general.  No calf pain.   Homan's is negative  MUSCLE LENGTH:       LOWER EXTREMITY ROM:  Active ROM Right eval Left eval R 01/30/24  Hip flexion     Hip extension     Hip abduction     Hip adduction     Hip internal rotation     Hip external rotation     Knee flexion 55 125 85 sitting 83 supine  Knee extension -8 -2 3  Ankle dorsiflexion     Ankle plantarflexion     Ankle inversion     Ankle eversion      Passive ROM Right eval Left eval  Hip flexion    Hip extension    Hip abduction    Hip adduction    Hip internal rotation    Hip external rotation    Knee flexion    Knee extension    Ankle dorsiflexion    Ankle plantarflexion    Ankle inversion    Ankle eversion    (Blank rows = not tested)  LOWER EXTREMITY MMT:  MMT Right eval Left eval  Hip flexion 4- 5  Hip extension    Hip abduction    Hip adduction    Hip internal rotation    Hip external rotation    Knee flexion 4- 5  Knee extension 2+ 5  Ankle dorsiflexion 4 5  Ankle plantarflexion    Ankle inversion    Ankle eversion     (Blank rows = not tested)  FUNCTIONAL TESTS:  TBD  GAIT: Distance walked: into clinic x 120-140' Assistive device utilized: Environmental Consultant - 2 wheeled Level of assistance: SBA and CGA Gait pattern: decreased step length- Left, decreased stance time- Right, and antalgic Comments: slow, guarded stiff legged gait pattern RLE with minimal knee flexion in swing, moderate limp   TODAY'S TREATMENT:  01/30/24 Nustep L3x76min UE/LE Standing B LE gastroc stretch on 6 step 2x73min- heel drop Seated BLE LAQ 2lb x 20  Supine RLE QS towel under heel x 20 Supine heel slide with peanut ball 2x10 SKTC knee flexion stretch x 1' x 2 RLE  Gait with SPC: 270 good heel/toe pattern, decreased R  knee flexion on swing  Standing heel/toe raises x 20 BLE Mini squat 2x10  Step ups RLE 4 x 10 Standing knee flexion stretch 10x5 sec 6 step Sidestep marching along counter 2lb weights 3x down and back; marching forward and back along counter 2lb weights 3x   MODALITIES: Vasopneumatic ice x 10' min to R knee at mod compression x 34 deg for edema  01/24/24 THERAPEUTIC EXERCISE: To improve strength, endurance, and ROM.  Demonstration, verbal and tactile cues throughout for technique. NuStep L1 x 6'  THERAPEUTIC ACTIVITIES: To improve functional performance.  Demonstration, verbal and tactile cues throughout for technique. Standing:  Toe raises no UE support x 20 BLE  Heel raises no UE support x  20 BLE  Minisquats x 10 BLE  Marching 2# x 15 BLE  Hip ABD 2# x 20 RLE  Hip ext 2# x 20 RLE  Knee flexion 2# x 20 RLE Sit n slide flexion stretch x 1' x 3 RLE Supine:   QS x 20 RLE SLR x 20 RLE HS x 20 RLE SAQ x 20 RLE Hip flexor stretch leg off bed x 1' RLE SKTC knee flexion stretch x 1' x 2 RLE  NEUROMUSCULAR RE-EDUCATION: To improve strength, coordination, and balance.  F/B gait at counter with 1UE support x 5 laps Sidestepping at counter x 5 laps  MODALITIES: Vasopneumatic ice x 15' min to R knee at lightest compression x 34 deg for edema  01/23/24 Nustep L2x28min UE/LE Seated R heel slides 2 x 10 Longsitting PF RTB x 20 Longsitting R hamstring stretch x 1 min Gait around clinic 180 ft with RW - decreased R knee flexion on swing Supine Quad sets with towel under knee x 20 Supine heel slides with strap 2x10  Standing with UE support on walker: Standing heel raises x 20 BLE Standing toe raise x 20 BLE Standing R HS curls x 10  Standing hip ABD x 10  Standing hip extension x 10 RLE Standing R knee flexion  x 20  Vaso 10 min to R knee mod compression   Eval SELF CARE: Provided education on PT POC progression, on post-surgical precautions, and on pain management  options; initial HEP    PATIENT EDUCATION:  Education details: HEP review and adding SKTC flexion stretch to HEP  Person educated: Patient Education method: Explanation, Demonstration, Verbal cues, Tactile cues, Handouts, and MedBridgeGO app access provided Education comprehension: verbalized understanding, returned demonstration, verbal cues required, tactile cues required, and needs further education  HOME EXERCISE PROGRAM: Access Code: [email or web address] URL: [email or web address] Date: 01/21/2024 Prepared by: Garnette Montclair  Exercises - Supine Ankle Pumps  - 1 x daily - 7 x weekly - 3 sets - 10 reps - Supine Quad Set  - 1 x daily - 7 x weekly - 3 sets - 10 reps - Supine Heel Slides  - 1 x daily - 7 x weekly - 3 sets - 10 reps - Supine Short Arc Quad  - 1 x daily - 7 x weekly - 3 sets - 10 reps - Supine Hip Abduction  - 1 x daily - 7 x weekly - 1-2 sets - 10 reps - Supine Straight Leg Raises  - 1 x daily - 7 x weekly - 1-2 sets - 10 reps - Standing Ankle Dorsiflexion with Table Support  - 1 x daily - 7 x weekly - 3 sets - 10 reps - Standing Heel Raises  - 1 x daily - 7 x weekly - 3 sets - 10 reps - Standing March with Counter Support  - 1 x daily - 7 x weekly - 3 sets - 10 reps - Standing Hip Abduction with Counter Support  - 1 x daily - 7 x weekly - 3 sets - 10 reps - Standing Hip Extension with Counter Support  - 1 x daily - 7 x weekly - 3 sets - 10 reps - Standing Knee Flexion with Counter Support  - 1 x daily - 7 x weekly - 3 sets - 10 reps - Seated Knee Extension Stretch with Chair  - 1 x daily - 7 x weekly - 1 sets - 3 reps - 3-5 min hold - Seated Knee Flexion Stretch  - 1  x daily - 7 x weekly - 1 sets - 2-3 reps - 1 min hold   ASSESSMENT:  CLINICAL IMPRESSION: Patient was able to complete all interventions today, although she did note some sharp pain with the LAQ on RLE. Her pain level decreased upon arrival today. We transitioned her to a [facility] in the home but  still to use walker outside. She did improve R knee AROM (3-85 degrees). Still mod swelling so we used vaso for this post session. PT remains necessary for strength, ROM, balance, gait, HEP deficits.   Continue per POC  MADHURI VACCA is a 59 y.o. female who was referred to physical therapy for evaluation and treatment for R TKA by Dr Eva on 01/18/24.   Patient reports moderate pain controlled with pain meds. Pain is worse with end range flexion and extension of the R knee.    She denies any calf pain and Homan's test is negative RLE.  She has moderate edema in the R knee.   Calf edema is mild.   She has TED hose that are placed today after ACE wrap is removed from the RLE.   She is ambulating as expected for post op day 3 with her walker with antalgic, slow, stiff, step to gait pattern.      Patient has deficits in R knee ROM, RLE strength, HEP knowledge, safety, gait, balance, and pain which are interfering with ADLs and are impacting quality of life.  On LEFS patient scored 18/80 demonstrating severe functional limitation.  Kennie will benefit from skilled PT to address above deficits to improve mobility and activity tolerance with decreased pain interference.  OBJECTIVE IMPAIRMENTS: difficulty walking, decreased ROM, decreased strength, increased edema, and pain.   ACTIVITY LIMITATIONS: carrying, lifting, bending, standing, squatting, sleeping, stairs, transfers, and locomotion level  PARTICIPATION LIMITATIONS: meal prep, cleaning, laundry, driving, shopping, and community activity  PERSONAL FACTORS: Age, Fitness, and 1-2 comorbidities: OA, hypthyroidism, GERD, autoimmune disorder of the skin (controlled) are also affecting patient's functional outcome.   REHAB POTENTIAL: Good  CLINICAL DECISION MAKING: Evolving/moderate complexity  EVALUATION COMPLEXITY: Moderate   GOALS: Goals reviewed with patient? Yes  SHORT TERM GOALS: Target date: 02/18/2024   Patient will be independent with  initial HEP. Baseline: 100% PT assist required for correct completion 01/25/24:  some assist required Goal status: IN PROGRESS  2.  Patient will report at least 25% improvement in R knee pain to improve QOL. Baseline: 8/10 01/25/24:  6/10 Goal status: IN PROGRESS  3.  Patient will ambulate independently with a cane indoors, outdoors Baseline: walker dependent with SBA/CGA 01/25/24:  single UE support at counter with CGA Goal status: PARTIALLY MET- 01/30/24 able to ambulate indoors with cane   LONG TERM GOALS: Target date: 03/17/2024   Patient will be independent with advanced/ongoing HEP to improve outcomes and carryover.  Baseline: no advanced HEP yet Goal status: INITIAL  2.  Patient will report at least 50-75% improvement in R knee pain to improve QOL. Baseline: 8/10 Goal status: INITIAL  3.  Patient will demonstrate improved R knee AROM to >/= 0-120 deg to allow for normal gait and stair mechanics. Baseline: Refer to above LE ROM table Goal status: INITIAL  4.  Patient will demonstrate improved RLE strength to >/= 5/5 for improved stability and ease of mobility. Baseline: Refer to above LE MMT table Goal status: INITIAL  5.  Patient will be able to ambulate 20-30 min with no device and normal gait pattern without increased pain  to access community.  Baseline: walker dependent for 150' with CGA/SBA Goal status: INITIAL  6. Patient will be able to ascend/descend stairs with 1 HR and reciprocal step pattern safely to access home and community.  Baseline: per patient report she negotiates 1 step at a time with 2 feet to a step with railing Goal status: INITIAL  7.  Patient will report >/= 50/80 on LEFS (MCID = 9 pts) to demonstrate improved functional ability. Baseline: 18/80 Goal status: INITIAL  8.  Patient will demonstrate at least 19/24 on DGI to decrease risk of falls. Baseline: TBD Goal status: INITIAL   PLAN:  PT FREQUENCY: 1-2x/week  PT DURATION: 8  weeks  PLANNED INTERVENTIONS: 97750- Physical Performance Testing, 97110-Therapeutic exercises, 97530- Therapeutic activity, [id number]- Neuromuscular re-education, 97535- Self Care, [id number]- Manual therapy, [phone]- Gait training, [phone]- Electrical stimulation (unattended), [id number]- Vasopneumatic device, [phone] (1-2 muscles), [id number] (3+ muscles)- Dry Needling, Patient/Family education, Balance training, Stair training, Taping, Joint mobilization, Cryotherapy, and Moist heat  PLAN FOR NEXT SESSION: check knee circumference;  gait with SPC; progress R knee flexion and extension ROM, strength, vaso for edema   Sol LITTIE Gaskins, PTA 01/30/2024, 2:09 PM  "

## 2024-01-31 ENCOUNTER — Ambulatory Visit: Payer: Self-pay

## 2024-01-31 ENCOUNTER — Ambulatory Visit: Admitting: Rehabilitation

## 2024-01-31 DIAGNOSIS — M6281 Muscle weakness (generalized): Secondary | ICD-10-CM

## 2024-01-31 DIAGNOSIS — M25561 Pain in right knee: Secondary | ICD-10-CM | POA: Diagnosis not present

## 2024-01-31 DIAGNOSIS — M25661 Stiffness of right knee, not elsewhere classified: Secondary | ICD-10-CM

## 2024-01-31 DIAGNOSIS — R600 Localized edema: Secondary | ICD-10-CM

## 2024-01-31 DIAGNOSIS — R2689 Other abnormalities of gait and mobility: Secondary | ICD-10-CM

## 2024-01-31 NOTE — Therapy (Signed)
 " OUTPATIENT PHYSICAL THERAPY LOWER EXTREMITY TREATMENT   Patient Name: Mandy Bond MRN: [id number] DOB:03-10-1965, 59 y.o., female Today's Date: 01/31/2024   END OF SESSION:  PT End of Session - 01/31/24 1323     Visit Number 5    Date for Recertification  03/17/24    PT Start Time 1316    PT Stop Time 1415    PT Time Calculation (min) 59 min    Activity Tolerance Patient tolerated treatment well    Behavior During Therapy [facility] for tasks assessed/performed             Past Medical History:  Diagnosis Date   AGUS favor benign 2007   w/u suggested endometriosis of cervix   Allergy    Arthritis    knees   Endometriosis 12/18/2007   STAGE 4--TAH, BSO   GERD (gastroesophageal reflux disease)    Hypothyroid    pt denies- no medication for this    Inflammatory autoimmune disorder of skin    controlled   Seasonal allergies    Ulcer 2005-2006   past hx long ago per pt.   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, 11/2012   negative high-risk HPV  on vaginal biopsy    Past Surgical History:  Procedure Laterality Date   ABDOMINAL HYSTERECTOMY  12/2007   TAH,BSO   ANKLE SURGERY Right    CHOLECYSTECTOMY     COLONOSCOPY  2017   DILATION AND CURETTAGE OF UTERUS     HYSTEROSCOPY  12/2000, 08/2005   AND D&C   KNEE SURGERY Bilateral    scopes   NEURECTOMY FOOT     OOPHORECTOMY     BSO   PELVIC LAPAROSCOPY     DIAG LAP/STAGE 4 ENDOMETRIOSIS   POLYPECTOMY     2017   REPLACEMENT TOTAL KNEE Left    TONSILLECTOMY     Patient Active Problem List   Diagnosis Date Noted   Seasonal allergies    Hypothyroid     PCP: Onita Rush, MD   REFERRING PROVIDER: Eva Fallow, MD   REFERRING DIAG: M17.11 (ICD-10-CM) - Unilateral primary osteoarthritis, right knee [phone] (ICD-10-CM) - Presence of left artificial knee joint  THERAPY DIAG:  Acute pain of right knee  Muscle weakness (generalized)  Other abnormalities of gait and  mobility  Stiffness of right knee, not elsewhere classified  Localized edema  RATIONALE FOR EVALUATION AND TREATMENT: Rehabilitation  ONSET DATE: 01/18/24  NEXT MD VISIT: 02/01/24   SUBJECTIVE:  SUBJECTIVE STATEMENT: Increased pain today from yesterday's session pain is 7/10 today medial knee  59 y/o patient referred to PT following  R TKA by Dr Eva on 01/18/24.  Her surgery was done as an outpatient and she is post op day 3.    She states she has had the R knee pain for some time now and conservative management was just no longer working.   She has an ACE wrap holding a gauze bandage in place over the R knee incision.  Her orders are to remove this on post op day 3, so she is assisted with removal today in clinic.   Her incision is clean, dry, nondraining, well approximated with all staples in place.   The incision is covered with bordered gauze provided by MD/surgery center.  She reports her pain is significant, but controlled with meds.   She states she hasn't really been able to bend her knee too much since the ACE wrap dressing was in the way.   However, she has been trying to work on it as much as possible over the weekend.  She reports her bowels have moved after she got home.  She is using a front wheeled walker for all mobility and is brought to PT today by her sister who is assisting her with care.   Patient's baseline level of mobility is independent gait with no device.   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   She plan to return to work after her recovery is complete.   She is aware to keep her R knee clean/dry/covered/no submerging.   She is aware that her bordered gauze bandage is not waterproof and that she should not get it wet,  and if she does  it must be changed.    PAIN: Are you having pain? Yes: NPRS scale:   7/10 worst Pain location: R knee Pain description: aching constantly;  sharper at times with moving the RLE Aggravating factors: end range flexion and extension Relieving factors: rest, meds  PERTINENT HISTORY:  OA, hypthyroidism, GERD, autoimmune disorder of the skin (controlled)  PRECAUTIONS: Knee;  TKA precautions  RED FLAGS: None  WEIGHT BEARING RESTRICTIONS: No  FALLS:  Has patient fallen in last 6 months? 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 Usual hobbies, recreational or sporting activities 0  3. Getting into/out of the bath 2  4. Walking between rooms 3  5. Putting on socks/shoes 0  6. Squatting  0  7. Lifting an object, like a bag of groceries from the floor 4  8. Performing light activities around your home 3  9. Performing heavy activities around your home 2  10. Getting into/out of a car 0  11. Walking 2 blocks 0  12. Walking 1 mile 0  13. Going up/down 10 stairs (1 flight) 1  14. Standing for 1 hour 0  15.  sitting for 1 hour 0  16. Running on even ground 0  17. Running on uneven ground 0  18. Making sharp turns while running fast 0  19. Hopping  0  20. Rolling over in bed 2  Score total:  18/80     COGNITION: Overall cognitive status: Within functional  limits for tasks assessed    SENSATION: WFL  EDEMA:  Circumferential: midpatellar:    RLE = 47 cm;  LLE = 41 cm  POSTURE:  rounded shoulders and forward head  PALPATION: Tender to palpate around the R knee in general.  No calf pain.   Homan's is negative  MUSCLE LENGTH:       LOWER EXTREMITY ROM:  Active ROM Right eval Left eval R 01/30/24  Hip flexion     Hip extension     Hip abduction     Hip adduction     Hip internal rotation     Hip external rotation     Knee flexion 55 125 85 sitting 83 supine  Knee extension -8 -2 3  Ankle dorsiflexion     Ankle plantarflexion     Ankle inversion     Ankle eversion      Passive ROM Right eval Left eval  Hip flexion    Hip extension    Hip abduction    Hip adduction    Hip internal rotation    Hip external rotation    Knee flexion    Knee extension    Ankle dorsiflexion    Ankle plantarflexion    Ankle inversion    Ankle eversion    (Blank rows = not tested)  LOWER EXTREMITY MMT:  MMT Right eval Left eval  Hip flexion 4- 5  Hip extension    Hip abduction    Hip adduction    Hip internal rotation    Hip external rotation    Knee flexion 4- 5  Knee extension 2+ 5  Ankle dorsiflexion 4 5  Ankle plantarflexion    Ankle inversion    Ankle eversion     (Blank rows = not tested)  FUNCTIONAL TESTS:  TBD  GAIT: Distance walked: into clinic x 120-140' Assistive device utilized: Environmental Consultant - 2 wheeled Level of assistance: SBA and CGA Gait pattern: decreased step length- Left, decreased stance time- Right, and antalgic Comments: slow, guarded stiff legged gait pattern RLE with minimal knee flexion in swing, moderate limp   TODAY'S TREATMENT:  01/31/24 Nustep L3x30min UE/LE Seated RLE LAQ + ankle DF 2x10 Seated R hamstring stretch x 1 min  Seated R knee flexion seated with slider 2x10 L SKTC stretch RLE extended x 1 min AA knee extension with active DF therapist holding LE in 60 deg hip flexion Gentle flexion  PROM R knee  Gait with SPC: 270 good heel/toe pattern, decreased R knee flexion on swing  01/30/24 Nustep L3x38min UE/LE Standing B LE gastroc stretch on 6 step 2x52min- heel drop Seated BLE LAQ 2lb x 20  Supine RLE QS towel under heel x 20 Supine heel slide with peanut ball 2x10 SKTC knee flexion stretch x 1' x 2 RLE  Gait with SPC: 270 good heel/toe pattern, decreased R knee flexion on swing  Standing heel/toe raises x 20 BLE Mini squat 2x10  Step ups RLE 4 x 10 Standing knee flexion stretch 10x5 sec 6 step Sidestep marching along counter 2lb weights 3x down and back; marching forward and back along counter 2lb weights 3x   MODALITIES: Vasopneumatic  ice x 10' min to R knee at mod compression x 34 deg for edema  01/24/24 THERAPEUTIC EXERCISE: To improve strength, endurance, and ROM.  Demonstration, verbal and tactile cues throughout for technique. NuStep L1 x 6'  THERAPEUTIC ACTIVITIES: To improve functional performance.  Demonstration, verbal and tactile cues throughout for technique. Standing:  Toe raises no UE support x 20 BLE  Heel raises no UE support x 20 BLE  Minisquats x 10 BLE  Marching 2# x 15 BLE  Hip ABD 2# x 20 RLE  Hip ext 2# x 20 RLE  Knee flexion 2# x 20 RLE Sit n slide flexion stretch x 1' x 3 RLE Supine:   QS x 20 RLE SLR x 20 RLE HS x 20 RLE SAQ x 20 RLE Hip flexor stretch leg off bed x 1' RLE SKTC knee flexion stretch x 1' x 2 RLE  NEUROMUSCULAR RE-EDUCATION: To improve strength, coordination, and balance.  F/B gait at counter with 1UE support x 5 laps Sidestepping at counter x 5 laps  MODALITIES: Vasopneumatic ice x 15' min to R knee at lightest compression x 34 deg for edema  01/23/24 Nustep L2x29min UE/LE Seated R heel slides 2 x 10 Longsitting PF RTB x 20 Longsitting R hamstring stretch x 1 min Gait around clinic 180 ft with RW - decreased R knee flexion on swing Supine Quad sets with towel under knee x 20 Supine heel slides with  strap 2x10  Standing with UE support on walker: Standing heel raises x 20 BLE Standing toe raise x 20 BLE Standing R HS curls x 10  Standing hip ABD x 10  Standing hip extension x 10 RLE Standing R knee flexion  x 20  Vaso 10 min to R knee mod compression   Eval SELF CARE: Provided education on PT POC progression, on post-surgical precautions, and on pain management options; initial HEP    PATIENT EDUCATION:  Education details: HEP review and adding SKTC flexion stretch to HEP  Person educated: Patient Education method: Explanation, Demonstration, Verbal cues, Tactile cues, Handouts, and MedBridgeGO app access provided Education comprehension: verbalized understanding, returned demonstration, verbal cues required, tactile cues required, and needs further education  HOME EXERCISE PROGRAM: Access Code: [email or web address] URL: [email or web address] Date: 01/21/2024 Prepared by: Garnette Montclair  Exercises - Supine Ankle Pumps  - 1 x daily - 7 x weekly - 3 sets - 10 reps - Supine Quad Set  - 1 x daily - 7 x weekly - 3 sets - 10 reps - Supine Heel Slides  - 1 x daily - 7 x weekly - 3 sets - 10 reps - Supine Short Arc Quad  - 1 x daily - 7 x weekly - 3 sets - 10 reps - Supine Hip Abduction  - 1 x daily - 7 x weekly - 1-2 sets - 10 reps - Supine Straight Leg Raises  - 1 x daily - 7 x weekly - 1-2 sets - 10 reps - Standing Ankle Dorsiflexion with Table Support  - 1 x daily - 7 x weekly - 3 sets - 10 reps - Standing Heel Raises  - 1 x daily - 7 x weekly - 3 sets - 10 reps - Standing March with Counter Support  - 1 x daily - 7 x weekly - 3 sets - 10 reps - Standing Hip Abduction with Counter Support  - 1 x daily - 7 x weekly - 3 sets - 10 reps - Standing Hip Extension with Counter Support  -  1 x daily - 7 x weekly - 3 sets - 10 reps - Standing Knee Flexion with Counter Support  - 1 x daily - 7 x weekly - 3 sets - 10 reps - Seated Knee Extension Stretch with Chair  - 1 x daily -  7 x weekly - 1 sets - 3 reps - 3-5 min hold - Seated Knee Flexion Stretch  - 1 x daily - 7 x weekly - 1 sets - 2-3 reps - 1 min hold   ASSESSMENT:  CLINICAL IMPRESSION: Pt with more pain today so we focused more on A/AAROM with light strengthening. Also worked on interventions to reduce her swelling and joint nutrition. Vaso post session for edema and pain. More medial knee pain noted today. PT remains necessary for strength, ROM, balance, gait, HEP deficits.   Continue per POC  Mandy Bond is a 59 y.o. female who was referred to physical therapy for evaluation and treatment for R TKA by Dr Eva on 01/18/24.   Patient reports moderate pain controlled with pain meds. Pain is worse with end range flexion and extension of the R knee.    She denies any calf pain and Homan's test is negative RLE.  She has moderate edema in the R knee.   Calf edema is mild.   She has TED hose that are placed today after ACE wrap is removed from the RLE.   She is ambulating as expected for post op day 3 with her walker with antalgic, slow, stiff, step to gait pattern.      Patient has deficits in R knee ROM, RLE strength, HEP knowledge, safety, gait, balance, and pain which are interfering with ADLs and are impacting quality of life.  On LEFS patient scored 18/80 demonstrating severe functional limitation.  Javana will benefit from skilled PT to address above deficits to improve mobility and activity tolerance with decreased pain interference.  OBJECTIVE IMPAIRMENTS: difficulty walking, decreased ROM, decreased strength, increased edema, and pain.   ACTIVITY LIMITATIONS: carrying, lifting, bending, standing, squatting, sleeping, stairs, transfers, and locomotion level  PARTICIPATION LIMITATIONS: meal prep, cleaning, laundry, driving, shopping, and community activity  PERSONAL FACTORS: Age, Fitness, and 1-2 comorbidities: OA, hypthyroidism, GERD, autoimmune disorder of the skin (controlled) are also affecting  patient's functional outcome.   REHAB POTENTIAL: Good  CLINICAL DECISION MAKING: Evolving/moderate complexity  EVALUATION COMPLEXITY: Moderate   GOALS: Goals reviewed with patient? Yes  SHORT TERM GOALS: Target date: 02/18/2024   Patient will be independent with initial HEP. Baseline: 100% PT assist required for correct completion 01/25/24:  some assist required Goal status: IN PROGRESS  2.  Patient will report at least 25% improvement in R knee pain to improve QOL. Baseline: 8/10 01/25/24:  6/10 Goal status: IN PROGRESS  3.  Patient will ambulate independently with a cane indoors, outdoors Baseline: walker dependent with SBA/CGA 01/25/24:  single UE support at counter with CGA Goal status: PARTIALLY MET- 01/30/24 able to ambulate indoors with cane   LONG TERM GOALS: Target date: 03/17/2024   Patient will be independent with advanced/ongoing HEP to improve outcomes and carryover.  Baseline: no advanced HEP yet Goal status: INITIAL  2.  Patient will report at least 50-75% improvement in R knee pain to improve QOL. Baseline: 8/10 Goal status: INITIAL  3.  Patient will demonstrate improved R knee AROM to >/= 0-120 deg to allow for normal gait and stair mechanics. Baseline: Refer to above LE ROM table Goal status: INITIAL  4.  Patient  will demonstrate improved RLE strength to >/= 5/5 for improved stability and ease of mobility. Baseline: Refer to above LE MMT table Goal status: INITIAL  5.  Patient will be able to ambulate 20-30 min with no device and normal gait pattern without increased pain to access community.  Baseline: walker dependent for 150' with CGA/SBA Goal status: INITIAL  6. Patient will be able to ascend/descend stairs with 1 HR and reciprocal step pattern safely to access home and community.  Baseline: per patient report she negotiates 1 step at a time with 2 feet to a step with railing Goal status: INITIAL  7.  Patient will report >/= 50/80 on LEFS  (MCID = 9 pts) to demonstrate improved functional ability. Baseline: 18/80 Goal status: INITIAL  8.  Patient will demonstrate at least 19/24 on DGI to decrease risk of falls. Baseline: TBD Goal status: INITIAL   PLAN:  PT FREQUENCY: 1-2x/week  PT DURATION: 8 weeks  PLANNED INTERVENTIONS: 97750- Physical Performance Testing, 97110-Therapeutic exercises, 97530- Therapeutic activity, [id number]- Neuromuscular re-education, 97535- Self Care, [id number]- Manual therapy, [phone]- Gait training, [phone]- Electrical stimulation (unattended), [id number]- Vasopneumatic device, [phone] (1-2 muscles), [phone] (3+ muscles)- Dry Needling, Patient/Family education, Balance training, Stair training, Taping, Joint mobilization, Cryotherapy, and Moist heat  PLAN FOR NEXT SESSION: check knee circumference;  gait with SPC; progress R knee flexion and extension ROM, strength, vaso for edema   Sol LITTIE Gaskins, PTA 01/31/2024, 2:03 PM  "

## 2024-02-01 ENCOUNTER — Ambulatory Visit: Admitting: Rehabilitation

## 2024-02-04 ENCOUNTER — Ambulatory Visit: Admitting: Physical Therapy

## 2024-02-07 ENCOUNTER — Ambulatory Visit

## 2024-02-07 DIAGNOSIS — M25661 Stiffness of right knee, not elsewhere classified: Secondary | ICD-10-CM

## 2024-02-07 DIAGNOSIS — M6281 Muscle weakness (generalized): Secondary | ICD-10-CM

## 2024-02-07 DIAGNOSIS — R600 Localized edema: Secondary | ICD-10-CM

## 2024-02-07 DIAGNOSIS — M25561 Pain in right knee: Secondary | ICD-10-CM

## 2024-02-07 DIAGNOSIS — R2689 Other abnormalities of gait and mobility: Secondary | ICD-10-CM

## 2024-02-07 NOTE — Therapy (Signed)
 " OUTPATIENT PHYSICAL THERAPY LOWER EXTREMITY TREATMENT   Patient Name: Mandy Bond MRN: [id number] DOB:Jun 01, 1965, 59 y.o., female Today's Date: 02/07/2024   END OF SESSION:  PT End of Session - 02/07/24 1023     Visit Number 6    Date for Recertification  03/17/24    PT Start Time 0935    PT Stop Time 1019    PT Time Calculation (min) 44 min    Activity Tolerance Patient tolerated treatment well    Behavior During Therapy [facility] for tasks assessed/performed              Past Medical History:  Diagnosis Date   AGUS favor benign 2007   w/u suggested endometriosis of cervix   Allergy    Arthritis    knees   Endometriosis 12/18/2007   STAGE 4--TAH, BSO   GERD (gastroesophageal reflux disease)    Hypothyroid    pt denies- no medication for this    Inflammatory autoimmune disorder of skin    controlled   Seasonal allergies    Ulcer 2005-2006   past hx long ago per pt.   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, 11/2012   negative high-risk HPV  on vaginal biopsy    Past Surgical History:  Procedure Laterality Date   ABDOMINAL HYSTERECTOMY  12/2007   TAH,BSO   ANKLE SURGERY Right    CHOLECYSTECTOMY     COLONOSCOPY  2017   DILATION AND CURETTAGE OF UTERUS     HYSTEROSCOPY  12/2000, 08/2005   AND D&C   KNEE SURGERY Bilateral    scopes   NEURECTOMY FOOT     OOPHORECTOMY     BSO   PELVIC LAPAROSCOPY     DIAG LAP/STAGE 4 ENDOMETRIOSIS   POLYPECTOMY     2017   REPLACEMENT TOTAL KNEE Left    TONSILLECTOMY     Patient Active Problem List   Diagnosis Date Noted   Seasonal allergies    Hypothyroid     PCP: Onita Rush, MD   REFERRING PROVIDER: Eva Fallow, MD   REFERRING DIAG: M17.11 (ICD-10-CM) - Unilateral primary osteoarthritis, right knee [id number] (ICD-10-CM) - Presence of left artificial knee joint  THERAPY DIAG:  Acute pain of right knee  Other abnormalities of gait and mobility  Stiffness of right knee, not  elsewhere classified  Muscle weakness (generalized)  Localized edema  RATIONALE FOR EVALUATION AND TREATMENT: Rehabilitation  ONSET DATE: 01/18/24  NEXT MD VISIT: 02/01/24   SUBJECTIVE:  SUBJECTIVE STATEMENT: Pain is not too bad today, pt report still taking nerve pill and muscle relaxers. Not using walker anymore, only cane outdoors does not use AD at home  59 y/o patient referred to PT following  R TKA by Dr Eva on 01/18/24.  Her surgery was done as an outpatient and she is post op day 3.    She states she has had the R knee pain for some time now and conservative management was just no longer working.   She has an ACE wrap holding a gauze bandage in place over the R knee incision.  Her orders are to remove this on post op day 3, so she is assisted with removal today in clinic.   Her incision is clean, dry, nondraining, well approximated with all staples in place.   The incision is covered with bordered gauze provided by MD/surgery center.  She reports her pain is significant, but controlled with meds.   She states she hasn't really been able to bend her knee too much since the ACE wrap dressing was in the way.   However, she has been trying to work on it as much as possible over the weekend.  She reports her bowels have moved after she got home.  She is using a front wheeled walker for all mobility and is brought to PT today by her sister who is assisting her with care.   Patient's baseline level of mobility is independent gait with no device.   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   She plan to return to work after her recovery is complete.   She is aware to keep her R knee clean/dry/covered/no submerging.   She is aware that her bordered gauze bandage is not  waterproof and that she should not get it wet,  and if she does it must be changed.    PAIN: Are you having pain? Yes: NPRS scale:   3/10 Pain location: R knee Pain description: aching constantly;  sharper at times with moving the RLE Aggravating factors: end range flexion and extension Relieving factors: rest, meds  PERTINENT HISTORY:  OA, hypthyroidism, GERD, autoimmune disorder of the skin (controlled)  PRECAUTIONS: Knee;  TKA precautions  RED FLAGS: None  WEIGHT BEARING RESTRICTIONS: No  FALLS:  Has patient fallen in last 6 months? 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 Usual hobbies, recreational or sporting activities 0  3. Getting into/out of the bath 2  4. Walking between rooms 3  5. Putting on socks/shoes 0  6. Squatting  0  7. Lifting an object, like a bag of groceries from the floor 4  8. Performing light activities around your home 3  9. Performing heavy activities around your home 2  10. Getting into/out of a car 0  11. Walking 2 blocks 0  12. Walking 1 mile 0  13. Going  up/down 10 stairs (1 flight) 1  14. Standing for 1 hour 0  15.  sitting for 1 hour 0  16. Running on even ground 0  17. Running on uneven ground 0  18. Making sharp turns while running fast 0  19. Hopping  0  20. Rolling over in bed 2  Score total:  18/80      COGNITION: Overall cognitive status: Within functional limits for tasks assessed    SENSATION: WFL  EDEMA:  Circumferential: midpatellar:    RLE = 47 cm;  LLE = 41 cm  POSTURE:  rounded shoulders and forward head  PALPATION: Tender to palpate around the R knee in general.  No calf pain.   Homan's is negative  MUSCLE LENGTH:       LOWER EXTREMITY ROM:  Active ROM Right eval Left eval R 01/30/24 R 02/07/24  Hip flexion      Hip extension      Hip abduction      Hip adduction      Hip internal rotation      Hip external rotation      Knee flexion 55 125 85 sitting 83 supine   Knee extension -8 -2 3 0  Ankle dorsiflexion      Ankle plantarflexion      Ankle inversion      Ankle eversion       Passive ROM Right eval Left eval  Hip flexion    Hip extension    Hip abduction    Hip adduction    Hip internal rotation    Hip external rotation    Knee flexion    Knee extension    Ankle dorsiflexion    Ankle plantarflexion    Ankle inversion    Ankle eversion    (Blank rows = not tested)  LOWER EXTREMITY MMT:  MMT Right eval Left eval  Hip flexion 4- 5  Hip extension    Hip abduction    Hip adduction    Hip internal rotation    Hip external rotation    Knee flexion 4- 5  Knee extension 2+ 5  Ankle dorsiflexion 4 5  Ankle plantarflexion    Ankle inversion    Ankle eversion     (Blank rows = not tested)  FUNCTIONAL TESTS:  TBD  GAIT: Distance walked: into clinic x 120-140' Assistive device utilized: Environmental Consultant - 2 wheeled Level of assistance: SBA and CGA Gait pattern: decreased step length- Left, decreased stance time- Right, and antalgic Comments: slow, guarded stiff legged gait pattern RLE with minimal knee flexion in swing, moderate limp   TODAY'S TREATMENT:  02/07/24 Bike x partial revolutions Gentle PROM into knee flexion x  R SKTC with LLE extended on table x 1 min Supine quad stretch with strap x 1 min  Standing heel raise from 2  book 2x10 Step ups RLE 4 step 2x10 Standing R knee flexion stretch 8 step 10x3 sec Church pews at counter x 20 Retro steps x 20 RLE Standing TKE with ball 2x10 with 5 sec hold    01/31/24 Nustep L3x71min UE/LE Seated RLE LAQ + ankle DF 2x10 Seated R hamstring stretch x 1 min  Seated R knee flexion seated with slider 2x10 L SKTC stretch RLE extended x 1 min AA knee extension with active DF therapist holding LE in 60 deg hip flexion Gentle flexion PROM R knee  Gait with SPC: 270 good heel/toe pattern, decreased R knee flexion on swing  01/30/24 Nustep L3x42min UE/LE Standing B LE gastroc stretch on 6 step 2x67min- heel drop Seated BLE LAQ 2lb x 20  Supine RLE QS towel under heel x 20 Supine heel slide with peanut ball 2x10 SKTC knee flexion stretch x 1' x 2 RLE  Gait with SPC: 270 good heel/toe pattern, decreased R knee flexion on swing  Standing heel/toe raises x 20 BLE Mini squat 2x10  Step ups RLE 4 x 10 Standing knee flexion stretch 10x5 sec 6 step Sidestep marching along counter 2lb weights 3x down and back; marching forward and back along counter 2lb weights 3x   MODALITIES: Vasopneumatic ice x 10' min to R knee at mod compression x 34 deg for edema  01/24/24 THERAPEUTIC EXERCISE: To improve strength, endurance, and ROM.  Demonstration, verbal and tactile cues throughout for technique. NuStep L1 x 6'  THERAPEUTIC ACTIVITIES: To improve functional performance.  Demonstration, verbal and tactile cues throughout for technique. Standing:  Toe raises no UE support x 20 BLE  Heel raises no UE support x 20 BLE  Minisquats x 10 BLE  Marching 2# x 15 BLE  Hip ABD 2# x 20 RLE  Hip ext 2# x 20 RLE  Knee flexion 2# x 20 RLE Sit n slide flexion stretch x 1' x 3 RLE Supine:   QS x 20 RLE SLR x 20 RLE HS x 20 RLE SAQ x 20 RLE Hip flexor stretch leg off bed x 1' RLE SKTC knee flexion stretch x 1' x 2 RLE  NEUROMUSCULAR RE-EDUCATION: To improve strength, coordination,  and balance.  F/B gait at counter with 1UE support x 5 laps Sidestepping at counter x 5 laps  MODALITIES: Vasopneumatic ice x 15' min to R knee at lightest compression x 34 deg for edema  01/23/24 Nustep L2x55min UE/LE Seated R heel slides 2 x 10 Longsitting PF RTB x 20 Longsitting R hamstring stretch x 1 min Gait around clinic 180 ft with RW - decreased R knee flexion on swing Supine Quad sets with towel under knee x 20 Supine heel slides with strap 2x10  Standing with UE support on walker: Standing heel raises x 20 BLE Standing toe raise x 20 BLE Standing R HS curls x 10  Standing hip ABD x 10  Standing hip extension x 10 RLE Standing R knee flexion  x 20  Vaso 10 min to R knee mod compression   Eval SELF CARE: Provided education on PT POC progression, on post-surgical precautions, and on pain management options; initial HEP    PATIENT EDUCATION:  Education details: HEP review and adding SKTC flexion stretch to HEP  Person educated: Patient Education method: Explanation, Demonstration, Verbal cues, Tactile cues, Handouts, and MedBridgeGO app access provided Education comprehension: verbalized understanding, returned demonstration, verbal cues required, tactile cues required, and needs further education  HOME EXERCISE PROGRAM: Access Code: [email or web address] URL: [email or web address] Date: 02/07/2024 Prepared by: Braydon Kullman  Exercises - Supine Ankle Pumps  - 1 x daily - 7 x weekly - 3 sets - 10 reps - Supine Quad Set  - 1 x daily - 7 x weekly - 3 sets - 10 reps - Supine Heel Slides  - 1 x daily - 7 x weekly - 3 sets - 10 reps - Supine Short Arc Quad  - 1 x daily - 7 x weekly - 3 sets - 10 reps - Supine Hip Abduction  - 1 x daily - 7 x weekly - 1-2 sets - 10 reps - Supine Straight  Leg Raises  - 1 x daily - 7 x weekly - 1-2 sets - 10 reps - Standing Ankle Dorsiflexion with Table Support  - 1 x daily - 7 x weekly - 3 sets - 10 reps - Standing Heel Raises  - 1  x daily - 7 x weekly - 3 sets - 10 reps - Standing March with Counter Support  - 1 x daily - 7 x weekly - 3 sets - 10 reps - Standing Hip Abduction with Counter Support  - 1 x daily - 7 x weekly - 3 sets - 10 reps - Standing Hip Extension with Counter Support  - 1 x daily - 7 x weekly - 3 sets - 10 reps - Standing Knee Flexion with Counter Support  - 1 x daily - 7 x weekly - 3 sets - 10 reps - Seated Knee Extension Stretch with Chair  - 1 x daily - 7 x weekly - 1 sets - 3 reps - 3-5 min hold - Seated Knee Flexion Stretch  - 1 x daily - 7 x weekly - 1 sets - 2-3 reps - 1 min hold - Terminal Knee Extension with Ball and Counter  - 1 x daily - 7 x weekly - 2-3 sets - 10 reps - 5 sec hold - Supine Quadriceps Stretch with Strap on Table  - 1 x daily - 7 x weekly - 3 sets - 3 reps - 1 min hold - Church Pew  - 1 x daily - 7 x weekly - 3 sets - 10 reps - Stride Stance Weight Shift  - 1 x daily - 7 x weekly - 3 sets - 10 reps   ASSESSMENT:  CLINICAL IMPRESSION: Pt is dong very well, she just had follow up with her surgeon and they were pleased. Her knee is fully straight at this point and flexion is at 96 degrees. Worked on NMR to allow for involuntary quad contractions and more knee flexion focused stretching to improve her motion. PT remains necessary for strength, ROM, balance, gait, HEP deficits.   Continue per POC  Mandy Bond is a 59 y.o. female who was referred to physical therapy for evaluation and treatment for R TKA by Dr Eva on 01/18/24.   Patient reports moderate pain controlled with pain meds. Pain is worse with end range flexion and extension of the R knee.    She denies any calf pain and Homan's test is negative RLE.  She has moderate edema in the R knee.   Calf edema is mild.   She has TED hose that are placed today after ACE wrap is removed from the RLE.   She is ambulating as expected for post op day 3 with her walker with antalgic, slow, stiff, step to gait pattern.      Patient  has deficits in R knee ROM, RLE strength, HEP knowledge, safety, gait, balance, and pain which are interfering with ADLs and are impacting quality of life.  On LEFS patient scored 18/80 demonstrating severe functional limitation.  Alsie will benefit from skilled PT to address above deficits to improve mobility and activity tolerance with decreased pain interference.  OBJECTIVE IMPAIRMENTS: difficulty walking, decreased ROM, decreased strength, increased edema, and pain.   ACTIVITY LIMITATIONS: carrying, lifting, bending, standing, squatting, sleeping, stairs, transfers, and locomotion level  PARTICIPATION LIMITATIONS: meal prep, cleaning, laundry, driving, shopping, and community activity  PERSONAL FACTORS: Age, Fitness, and 1-2 comorbidities: OA, hypthyroidism, GERD, autoimmune disorder of the skin (controlled)  are also affecting patient's functional outcome.   REHAB POTENTIAL: Good  CLINICAL DECISION MAKING: Evolving/moderate complexity  EVALUATION COMPLEXITY: Moderate   GOALS: Goals reviewed with patient? Yes  SHORT TERM GOALS: Target date: 02/18/2024   Patient will be independent with initial HEP. Baseline: 100% PT assist required for correct completion 01/25/24:  some assist required Goal status: IN PROGRESS  2.  Patient will report at least 25% improvement in R knee pain to improve QOL. Baseline: 8/10 01/25/24:  6/10 Goal status: IN PROGRESS  3.  Patient will ambulate independently with a cane indoors, outdoors Baseline: walker dependent with SBA/CGA 01/25/24:  single UE support at counter with CGA Goal status: PARTIALLY MET- 01/30/24 able to ambulate indoors with cane   LONG TERM GOALS: Target date: 03/17/2024   Patient will be independent with advanced/ongoing HEP to improve outcomes and carryover.  Baseline: no advanced HEP yet Goal status: INITIAL  2.  Patient will report at least 50-75% improvement in R knee pain to improve QOL. Baseline: 8/10 Goal status:  INITIAL  3.  Patient will demonstrate improved R knee AROM to >/= 0-120 deg to allow for normal gait and stair mechanics. Baseline: Refer to above LE ROM table Goal status: INITIAL  4.  Patient will demonstrate improved RLE strength to >/= 5/5 for improved stability and ease of mobility. Baseline: Refer to above LE MMT table Goal status: INITIAL  5.  Patient will be able to ambulate 20-30 min with no device and normal gait pattern without increased pain to access community.  Baseline: walker dependent for 150' with CGA/SBA Goal status: INITIAL  6. Patient will be able to ascend/descend stairs with 1 HR and reciprocal step pattern safely to access home and community.  Baseline: per patient report she negotiates 1 step at a time with 2 feet to a step with railing Goal status: INITIAL  7.  Patient will report >/= 50/80 on LEFS (MCID = 9 pts) to demonstrate improved functional ability. Baseline: 18/80 Goal status: INITIAL  8.  Patient will demonstrate at least 19/24 on DGI to decrease risk of falls. Baseline: TBD Goal status: INITIAL   PLAN:  PT FREQUENCY: 1-2x/week  PT DURATION: 8 weeks  PLANNED INTERVENTIONS: 97750- Physical Performance Testing, 97110-Therapeutic exercises, 97530- Therapeutic activity, 97112- Neuromuscular re-education, 97535- Self Care, 02859- Manual therapy, [phone]- Gait training, [phone]- Electrical stimulation (unattended), [id number]- Vasopneumatic device, [phone] (1-2 muscles), 20561 (3+ muscles)- Dry Needling, Patient/Family education, Balance training, Stair training, Taping, Joint mobilization, Cryotherapy, and Moist heat  PLAN FOR NEXT SESSION:  gait with SPC; progress R knee flexion and extension ROM, strength, vaso for edema   Sol LITTIE Gaskins, PTA 02/07/2024, 11:43 AM  "

## 2024-02-08 ENCOUNTER — Ambulatory Visit

## 2024-02-08 DIAGNOSIS — R600 Localized edema: Secondary | ICD-10-CM

## 2024-02-08 DIAGNOSIS — M6281 Muscle weakness (generalized): Secondary | ICD-10-CM

## 2024-02-08 DIAGNOSIS — R2689 Other abnormalities of gait and mobility: Secondary | ICD-10-CM

## 2024-02-08 DIAGNOSIS — M25661 Stiffness of right knee, not elsewhere classified: Secondary | ICD-10-CM

## 2024-02-08 DIAGNOSIS — M25561 Pain in right knee: Secondary | ICD-10-CM

## 2024-02-08 NOTE — Therapy (Signed)
 " OUTPATIENT PHYSICAL THERAPY LOWER EXTREMITY TREATMENT   Patient Name: Mandy Bond MRN: [id number] DOB:[birth year], 59 y.o., female Today's Date: 02/08/2024   END OF SESSION:  PT End of Session - 02/08/24 1105     Visit Number 7    Date for Recertification  03/17/24    PT Start Time 1019    PT Stop Time 1115    PT Time Calculation (min) 56 min    Activity Tolerance Patient tolerated treatment well    Behavior During Therapy [facility] for tasks assessed/performed               Past Medical History:  Diagnosis Date   AGUS favor benign 2007   w/u suggested endometriosis of cervix   Allergy    Arthritis    knees   Endometriosis 12/18/2007   STAGE 4--TAH, BSO   GERD (gastroesophageal reflux disease)    Hypothyroid    pt denies- no medication for this    Inflammatory autoimmune disorder of skin    controlled   Seasonal allergies    Ulcer 2005-2006   past hx long ago per pt.   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, 11/2012   negative high-risk HPV  on vaginal biopsy    Past Surgical History:  Procedure Laterality Date   ABDOMINAL HYSTERECTOMY  12/2007   TAH,BSO   ANKLE SURGERY Right    CHOLECYSTECTOMY     COLONOSCOPY  2017   DILATION AND CURETTAGE OF UTERUS     HYSTEROSCOPY  12/2000, 08/2005   AND D&C   KNEE SURGERY Bilateral    scopes   NEURECTOMY FOOT     OOPHORECTOMY     BSO   PELVIC LAPAROSCOPY     DIAG LAP/STAGE 4 ENDOMETRIOSIS   POLYPECTOMY     2017   REPLACEMENT TOTAL KNEE Left    TONSILLECTOMY     Patient Active Problem List   Diagnosis Date Noted   Seasonal allergies    Hypothyroid     PCP: Onita Rush, MD   REFERRING PROVIDER: Eva Fallow, MD   REFERRING DIAG: M17.11 (ICD-10-CM) - Unilateral primary osteoarthritis, right knee [id number] (ICD-10-CM) - Presence of left artificial knee joint  THERAPY DIAG:  Acute pain of right knee  Other abnormalities of gait and mobility  Stiffness of right knee, not  elsewhere classified  Muscle weakness (generalized)  Localized edema  RATIONALE FOR EVALUATION AND TREATMENT: Rehabilitation  ONSET DATE: 01/18/24  NEXT MD VISIT: 02/01/24   SUBJECTIVE:  SUBJECTIVE STATEMENT: Increased pain today most likely from workout yesterday  59 y/o patient referred to PT following  R TKA by Dr Eva on 01/18/24.  Her surgery was done as an outpatient and she is post op day 3.    She states she has had the R knee pain for some time now and conservative management was just no longer working.   She has an ACE wrap holding a gauze bandage in place over the R knee incision.  Her orders are to remove this on post op day 3, so she is assisted with removal today in clinic.   Her incision is clean, dry, nondraining, well approximated with all staples in place.   The incision is covered with bordered gauze provided by MD/surgery center.  She reports her pain is significant, but controlled with meds.   She states she hasn't really been able to bend her knee too much since the ACE wrap dressing was in the way.   However, she has been trying to work on it as much as possible over the weekend.  She reports her bowels have moved after she got home.  She is using a front wheeled walker for all mobility and is brought to PT today by her sister who is assisting her with care.   Patient's baseline level of mobility is independent gait with no device.   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   She plan to return to work after her recovery is complete.   She is aware to keep her R knee clean/dry/covered/no submerging.   She is aware that her bordered gauze bandage is not waterproof and that she should not get it wet,  and if she does it must be changed.     PAIN: Are you having pain? Yes: NPRS scale:   6/10 Pain location: R knee Pain description: aching constantly;  sharper at times with moving the RLE Aggravating factors: end range flexion and extension Relieving factors: rest, meds  PERTINENT HISTORY:  OA, hypthyroidism, GERD, autoimmune disorder of the skin (controlled)  PRECAUTIONS: Knee;  TKA precautions  RED FLAGS: None  WEIGHT BEARING RESTRICTIONS: No  FALLS:  Has patient fallen in last 6 months? 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 Usual hobbies, recreational or sporting activities 0  3. Getting into/out of the bath 2  4. Walking between rooms 3  5. Putting on socks/shoes 0  6. Squatting  0  7. Lifting an object, like a bag of groceries from the floor 4  8. Performing light activities around your home 3  9. Performing heavy activities around your home 2  10. Getting into/out of a car 0  11. Walking 2 blocks 0  12. Walking 1 mile 0  13. Going up/down 10 stairs (1 flight) 1  14. Standing for 1 hour 0  15.  sitting for 1 hour  0  16. Running on even ground 0  17. Running on uneven ground 0  18. Making sharp turns while running fast 0  19. Hopping  0  20. Rolling over in bed 2  Score total:  18/80     COGNITION: Overall cognitive status: Within functional limits for tasks  assessed    SENSATION: WFL  EDEMA:  Circumferential: midpatellar:    RLE = 47 cm;  LLE = 41 cm  POSTURE:  rounded shoulders and forward head  PALPATION: Tender to palpate around the R knee in general.  No calf pain.   Homan's is negative  MUSCLE LENGTH:       LOWER EXTREMITY ROM:  Active ROM Right eval Left eval R 01/30/24 R 02/07/24  Hip flexion      Hip extension      Hip abduction      Hip adduction      Hip internal rotation      Hip external rotation      Knee flexion 55 125 85 sitting 83 supine   Knee extension -8 -2 3 0  Ankle dorsiflexion      Ankle plantarflexion      Ankle inversion      Ankle eversion       Passive ROM Right eval Left eval  Hip flexion    Hip extension    Hip abduction    Hip adduction    Hip internal rotation    Hip external rotation    Knee flexion    Knee extension    Ankle dorsiflexion    Ankle plantarflexion    Ankle inversion    Ankle eversion    (Blank rows = not tested)  LOWER EXTREMITY MMT:  MMT Right eval Left eval  Hip flexion 4- 5  Hip extension    Hip abduction    Hip adduction    Hip internal rotation    Hip external rotation    Knee flexion 4- 5  Knee extension 2+ 5  Ankle dorsiflexion 4 5  Ankle plantarflexion    Ankle inversion    Ankle eversion     (Blank rows = not tested)  FUNCTIONAL TESTS:  TBD  GAIT: Distance walked: into clinic x 120-140' Assistive device utilized: Environmental Consultant - 2 wheeled Level of assistance: SBA and CGA Gait pattern: decreased step length- Left, decreased stance time- Right, and antalgic Comments: slow, guarded stiff legged gait pattern RLE with minimal knee flexion in swing, moderate limp   TODAY'S TREATMENT:  02/08/2024 Bike x partial revolutions Gentle PROM in prone knee flex RLE STM to R VMO with MWM ankle DF with TPR 2x10 Prone R quad stretch 2x30 sec with strap Seated R hamstring curls RTB 2x10 Seated R LAQ + hip ADD 2x10 Tandem stance 2x30 each LE Tandem walk  counter length 2x each way fwd and retro Vasopneumatic ice x 10' min to R knee at low compression x 34 deg for edema  02/07/24 Bike x partial revolutions Gentle PROM into knee flexion x  R SKTC with LLE extended on table x 1 min Supine quad stretch with strap x 1 min  Standing heel raise from 2 book 2x10 Step ups RLE 4 step 2x10 Standing R knee flexion stretch 8 step 10x3 sec Church pews at counter x 20 Retro steps x 20 RLE Standing TKE with ball 2x10 with 5 sec hold    01/31/24 Nustep L3x79min UE/LE Seated RLE LAQ + ankle DF 2x10 Seated  R hamstring stretch x 1 min  Seated R knee flexion seated with slider 2x10 L SKTC stretch RLE extended x 1 min AA knee extension with active DF therapist holding LE in 60 deg hip flexion Gentle flexion PROM R knee  Gait with SPC: 270 good heel/toe pattern, decreased R knee flexion on swing  01/30/24 Nustep L3x67min UE/LE Standing B LE gastroc stretch on 6 step 2x89min- heel drop Seated BLE LAQ 2lb x 20  Supine RLE QS towel under heel x 20 Supine heel slide with peanut ball 2x10 SKTC knee flexion stretch x 1' x 2 RLE  Gait with SPC: 270 good heel/toe pattern, decreased R knee flexion on swing  Standing heel/toe raises x 20 BLE Mini squat 2x10  Step ups RLE 4 x 10 Standing knee flexion stretch 10x5 sec 6 step Sidestep marching along counter 2lb weights 3x down and back; marching forward and back along counter 2lb weights 3x   MODALITIES: Vasopneumatic ice x 10' min to R knee at mod compression x 34 deg for edema  01/24/24 THERAPEUTIC EXERCISE: To improve strength, endurance, and ROM.  Demonstration, verbal and tactile cues throughout for technique. NuStep L1 x 6'  THERAPEUTIC ACTIVITIES: To improve functional performance.  Demonstration, verbal and tactile cues throughout for technique. Standing:  Toe raises no UE support x 20 BLE  Heel raises no UE support x 20 BLE  Minisquats x 10 BLE  Marching 2# x 15 BLE  Hip ABD  2# x 20 RLE  Hip ext 2# x 20 RLE  Knee flexion 2# x 20 RLE Sit n slide flexion stretch x 1' x 3 RLE Supine:   QS x 20 RLE SLR x 20 RLE HS x 20 RLE SAQ x 20 RLE Hip flexor stretch leg off bed x 1' RLE SKTC knee flexion stretch x 1' x 2 RLE  NEUROMUSCULAR RE-EDUCATION: To improve strength, coordination, and balance.  F/B gait at counter with 1UE support x 5 laps Sidestepping at counter x 5 laps  MODALITIES: Vasopneumatic ice x 15' min to R knee at lightest compression x 34 deg for edema  01/23/24 Nustep L2x9min UE/LE Seated R heel slides 2 x 10 Longsitting PF RTB x 20 Longsitting R hamstring stretch x 1 min Gait around clinic 180 ft with RW - decreased R knee flexion on swing Supine Quad sets with towel under knee x 20 Supine heel slides with strap 2x10  Standing with UE support on walker: Standing heel raises x 20 BLE Standing toe raise x 20 BLE Standing R HS curls x 10  Standing hip ABD x 10  Standing hip extension x 10 RLE Standing R knee flexion  x 20  Vaso 10 min to R knee mod compression   Eval SELF CARE: Provided education on PT POC progression, on post-surgical precautions, and on pain management options; initial HEP    PATIENT EDUCATION:  Education details: HEP review and adding SKTC flexion stretch to HEP  Person educated: Patient Education method: Explanation, Demonstration, Verbal cues, Tactile cues, Handouts, and MedBridgeGO app access provided Education comprehension: verbalized understanding, returned demonstration, verbal cues required, tactile cues required, and needs further education  HOME EXERCISE PROGRAM: Access Code: [email or web address] URL: [email or web address] Date: 02/07/2024 Prepared by: Keonta Alsip  Exercises - Supine Ankle Pumps  - 1 x daily - 7 x weekly - 3 sets - 10 reps - Supine Quad Set  - 1 x daily - 7 x weekly - 3 sets - 10 reps - Supine Heel Slides  -  1 x daily - 7 x weekly - 3 sets - 10 reps - Supine Short Arc Quad  -  1 x daily - 7 x weekly - 3 sets - 10 reps - Supine Hip Abduction  - 1 x daily - 7 x weekly - 1-2 sets - 10 reps - Supine Straight Leg Raises  - 1 x daily - 7 x weekly - 1-2 sets - 10 reps - Standing Ankle Dorsiflexion with Table Support  - 1 x daily - 7 x weekly - 3 sets - 10 reps - Standing Heel Raises  - 1 x daily - 7 x weekly - 3 sets - 10 reps - Standing March with Counter Support  - 1 x daily - 7 x weekly - 3 sets - 10 reps - Standing Hip Abduction with Counter Support  - 1 x daily - 7 x weekly - 3 sets - 10 reps - Standing Hip Extension with Counter Support  - 1 x daily - 7 x weekly - 3 sets - 10 reps - Standing Knee Flexion with Counter Support  - 1 x daily - 7 x weekly - 3 sets - 10 reps - Seated Knee Extension Stretch with Chair  - 1 x daily - 7 x weekly - 1 sets - 3 reps - 3-5 min hold - Seated Knee Flexion Stretch  - 1 x daily - 7 x weekly - 1 sets - 2-3 reps - 1 min hold - Terminal Knee Extension with Ball and Counter  - 1 x daily - 7 x weekly - 2-3 sets - 10 reps - 5 sec hold - Supine Quadriceps Stretch with Strap on Table  - 1 x daily - 7 x weekly - 3 sets - 3 reps - 1 min hold - Church Pew  - 1 x daily - 7 x weekly - 3 sets - 10 reps - Stride Stance Weight Shift  - 1 x daily - 7 x weekly - 3 sets - 10 reps   ASSESSMENT:  CLINICAL IMPRESSION: Pt continues to do well, however her pain was elevated today from yesterday's session. We did more NWB exercises today for strengthening and some balance activities as well. Pt notes pain along the R VMO and medial portion of her knee which we addressed with MT. Vaso post session utilized for pain management and edema today. PT remains necessary for strength, ROM, balance, gait, HEP deficits.   Continue per POC  GERLINE RATTO is a 59 y.o. female who was referred to physical therapy for evaluation and treatment for R TKA by Dr Eva on 01/18/24.   Patient reports moderate pain controlled with pain meds. Pain is worse with end range  flexion and extension of the R knee.    She denies any calf pain and Homan's test is negative RLE.  She has moderate edema in the R knee.   Calf edema is mild.   She has TED hose that are placed today after ACE wrap is removed from the RLE.   She is ambulating as expected for post op day 3 with her walker with antalgic, slow, stiff, step to gait pattern.      Patient has deficits in R knee ROM, RLE strength, HEP knowledge, safety, gait, balance, and pain which are interfering with ADLs and are impacting quality of life.  On LEFS patient scored 18/80 demonstrating severe functional limitation.  Marcayla will benefit from skilled PT to address above deficits to improve mobility and activity tolerance  with decreased pain interference.  OBJECTIVE IMPAIRMENTS: difficulty walking, decreased ROM, decreased strength, increased edema, and pain.   ACTIVITY LIMITATIONS: carrying, lifting, bending, standing, squatting, sleeping, stairs, transfers, and locomotion level  PARTICIPATION LIMITATIONS: meal prep, cleaning, laundry, driving, shopping, and community activity  PERSONAL FACTORS: Age, Fitness, and 1-2 comorbidities: OA, hypthyroidism, GERD, autoimmune disorder of the skin (controlled) are also affecting patient's functional outcome.   REHAB POTENTIAL: Good  CLINICAL DECISION MAKING: Evolving/moderate complexity  EVALUATION COMPLEXITY: Moderate   GOALS: Goals reviewed with patient? Yes  SHORT TERM GOALS: Target date: 02/18/2024   Patient will be independent with initial HEP. Baseline: 100% PT assist required for correct completion 01/25/24:  some assist required Goal status: IN PROGRESS  2.  Patient will report at least 25% improvement in R knee pain to improve QOL. Baseline: 8/10 01/25/24:  6/10 Goal status: IN PROGRESS  3.  Patient will ambulate independently with a cane indoors, outdoors Baseline: walker dependent with SBA/CGA 01/25/24:  single UE support at counter with CGA Goal status:  PARTIALLY MET- 01/30/24 able to ambulate indoors with cane   LONG TERM GOALS: Target date: 03/17/2024   Patient will be independent with advanced/ongoing HEP to improve outcomes and carryover.  Baseline: no advanced HEP yet Goal status: INITIAL  2.  Patient will report at least 50-75% improvement in R knee pain to improve QOL. Baseline: 8/10 Goal status: INITIAL  3.  Patient will demonstrate improved R knee AROM to >/= 0-120 deg to allow for normal gait and stair mechanics. Baseline: Refer to above LE ROM table Goal status: IN PROGRESS- 02/07/24  4.  Patient will demonstrate improved RLE strength to >/= 5/5 for improved stability and ease of mobility. Baseline: Refer to above LE MMT table Goal status: INITIAL  5.  Patient will be able to ambulate 20-30 min with no device and normal gait pattern without increased pain to access community.  Baseline: walker dependent for 150' with CGA/SBA Goal status: INITIAL  6. Patient will be able to ascend/descend stairs with 1 HR and reciprocal step pattern safely to access home and community.  Baseline: per patient report she negotiates 1 step at a time with 2 feet to a step with railing Goal status: INITIAL  7.  Patient will report >/= 50/80 on LEFS (MCID = 9 pts) to demonstrate improved functional ability. Baseline: 18/80 Goal status: INITIAL  8.  Patient will demonstrate at least 19/24 on DGI to decrease risk of falls. Baseline: TBD Goal status: INITIAL   PLAN:  PT FREQUENCY: 1-2x/week  PT DURATION: 8 weeks  PLANNED INTERVENTIONS: 97750- Physical Performance Testing, 97110-Therapeutic exercises, 97530- Therapeutic activity, [id number]- Neuromuscular re-education, 97535- Self Care, 02859- Manual therapy, [phone]- Gait training, [phone]- Electrical stimulation (unattended), 97016- Vasopneumatic device, [phone] (1-2 muscles), [id number] (3+ muscles)- Dry Needling, Patient/Family education, Balance training, Stair training, Taping, Joint mobilization,  Cryotherapy, and Moist heat  PLAN FOR NEXT SESSION:  gait with SPC; progress R knee flexion and extension ROM, strength, vaso for edema   Analise Glotfelty L Horace Wishon, PTA 02/08/2024, 11:05 AM  "

## 2024-02-11 ENCOUNTER — Ambulatory Visit

## 2024-02-14 ENCOUNTER — Ambulatory Visit: Admitting: Rehabilitation

## 2024-02-18 ENCOUNTER — Ambulatory Visit: Admitting: Rehabilitation
